# Patient Record
Sex: Female | Born: 1970 | Race: Black or African American | Hispanic: No | Marital: Single | Smoking: Never smoker
Health system: Southern US, Community
[De-identification: ages and names within clinical notes are randomized; demographics above are authoritative.]

## PROBLEM LIST (undated history)

## (undated) DIAGNOSIS — D649 Anemia, unspecified: Secondary | ICD-10-CM

## (undated) DIAGNOSIS — F419 Anxiety disorder, unspecified: Secondary | ICD-10-CM

## (undated) DIAGNOSIS — T7840XA Allergy, unspecified, initial encounter: Secondary | ICD-10-CM

## (undated) HISTORY — PX: NASAL SEPTUM SURGERY: SHX37

## (undated) HISTORY — PX: FOOT SURGERY: SHX648

## (undated) HISTORY — DX: Anxiety disorder, unspecified: F41.9

## (undated) HISTORY — PX: COSMETIC SURGERY: SHX468

## (undated) HISTORY — DX: Allergy, unspecified, initial encounter: T78.40XA

## (undated) HISTORY — DX: Anemia, unspecified: D64.9

## (undated) HISTORY — PX: PR UNLISTED PROCEDURE SHOULDER: 23929

## (undated) DEATH — deceased

---

## 2008-01-28 ENCOUNTER — Encounter: Payer: Self-pay | Admitting: Physician Assistant

## 2008-03-31 ENCOUNTER — Encounter: Payer: Self-pay | Admitting: Physician Assistant

## 2010-05-17 ENCOUNTER — Telehealth (INDEPENDENT_AMBULATORY_CARE_PROVIDER_SITE_OTHER): Payer: Self-pay | Admitting: *Deleted

## 2010-05-17 ENCOUNTER — Encounter (INDEPENDENT_AMBULATORY_CARE_PROVIDER_SITE_OTHER): Payer: Self-pay | Admitting: *Deleted

## 2010-05-17 ENCOUNTER — Emergency Department (HOSPITAL_COMMUNITY): Admission: EM | Admit: 2010-05-17 | Discharge: 2010-05-17 | Payer: Self-pay | Admitting: Emergency Medicine

## 2010-05-18 ENCOUNTER — Telehealth (INDEPENDENT_AMBULATORY_CARE_PROVIDER_SITE_OTHER): Payer: Self-pay | Admitting: *Deleted

## 2010-05-21 ENCOUNTER — Ambulatory Visit: Payer: Self-pay | Admitting: Internal Medicine

## 2010-05-21 DIAGNOSIS — R197 Diarrhea, unspecified: Secondary | ICD-10-CM | POA: Insufficient documentation

## 2010-05-21 DIAGNOSIS — A0472 Enterocolitis due to Clostridium difficile, not specified as recurrent: Secondary | ICD-10-CM | POA: Insufficient documentation

## 2010-05-21 DIAGNOSIS — K219 Gastro-esophageal reflux disease without esophagitis: Secondary | ICD-10-CM | POA: Insufficient documentation

## 2010-06-22 ENCOUNTER — Telehealth: Payer: Self-pay | Admitting: Physician Assistant

## 2010-11-20 NOTE — Progress Notes (Signed)
Summary: New PT appt  Phone Note Outgoing Call Call back at [facility] Phone [phone]   Call placed by: Chales Abrahams CMA Duncan Dull),  May 17, 2010 2:45 PM Summary of Call: called pt with appt time and date 05/21/10 Amy at 1:30 pm letter mailed Initial call taken by: Chales Abrahams CMA Duncan Dull),  May 17, 2010 2:45 PM

## 2010-11-20 NOTE — Progress Notes (Signed)
Summary: Request for an MD appt  Phone Note Call from Patient   Caller: Patient Summary of Call: Pt is calling with concerns about seeing  any provider  other than an MD.  She states she will ONLY see an MD and was requesting to see the MD that was speaking with the ER Dr yesterday when she was there.  I explained that was Willette Cluster NP.  She insist that she see an MD on Monday.  I explained there are no available appointments with an MD on Monday.    I tried to explain that the NP and PA work directly with a Dr and she was adamant about seeing an MD.  I advised her I would speak with Gunnar Fusi and give her a call back. Initial call taken by: Chales Abrahams CMA Duncan Dull),  May 18, 2010 9:22 AM  Follow-up for Phone Call        I spoke with Dr Russella Dar (doc of the day)  who suggest the pt keep the  appt with [facility].  If the pt is unwilling to keep this appt she can be scheduled with the first available MD or see her PCP.  left message on machine to call back Chales Abrahams CMA Duncan Dull)  May 21, 2010 9:55 AM   Additional Follow-up for Phone Call Additional follow up Details #1::        pt called back and is willing to see Amy today.  She has questions about her insurance and Morrie Sheldon advised her to call her [employer]. Additional Follow-up by: Chales Abrahams CMA Duncan Dull),  May 21, 2010 10:23 AM

## 2010-11-20 NOTE — Progress Notes (Signed)
Summary: complaint  Phone Note Call from Patient Call back at Home Phone [phone]   Caller: Patient Call For: Clarnce Flock  Summary of Call: pt has a complaint about her bill regarding her ov with Amy Esterwood on 05/21/2010... pt said that she spoke with someone at the front desk (doesnt have a name) prior to scheduling the appt and asked how much the visit was going to cost as a self-pay pt... pt states that she was told her appt would cost her $184... pt understood this to mean that her appt in its entirity would cost $184 total... pt now billed $365 with a $345 balance (she paid only $20 copay on the day of her ov) and she says had she known that it would be more than $184, she would not have sch'ed... pt said she cannot afford the $345 bill... already explained to pt the price range of our new patient visits and that $184 is the AVERAGE of this range Initial call taken by: Vallarie Mare,  June 22, 2010 8:59 AM  Follow-up for Phone Call        after consulting with Aurea Graff, I called/spoke with pt yesterday to inform her that her bill will be reprocessed to reflect the $184 charge and $20 payment already made... told pt she will be receiving the new bill in the near future... pt was very pleased and thankful... told the pt to please call me back with any questions Follow-up by: Vallarie Mare,  July 06, 2010 10:07 AM

## 2010-11-20 NOTE — Assessment & Plan Note (Signed)
Summary: C diff/pl   per Gunnar Fusi   History of Present Illness Visit Type: new patient  Primary GI MD: Yancey Flemings MD Primary Provider: Lyman Speller. Denny Peon, MD  Requesting Provider: na Chief Complaint: Diarrhea, bloating, left side abd pain,  headaches, and fatigue History of Present Illness:   40 Y.O FEMALE NEW TO G.I TODAY-REFERRED BY ER. PT HAD ONSET OF DIARRHEA, FEVER,MALAISE,NAUSEA ABOUT 8 DAYS AGO. SHEHAD TAKEN A COURSE OF CLINDAMYCIN FOR A SORE THROAT-FINISHED ABOUT  3 WEEKS BEFORE SXS STARTED. SHE BEGAN WITH ABDOMINAL DISCOMFORT, CRAMPING,AND 7-8 WATERY BLOODY STOOLS PER DAY.. SHE WAS SEEN BACK AT URGENT CARE AND STARTED ON FLAGYL ON 7/26. SHE HAD STOOL FOR C.DIFF WHICH WAS NEGATIVE. SHE WENT TO THE E.R. ON 7/29 AS SHE WAS NOT FEELING ANY BETTER. SHE HAS BEEN STRESSED BECAUSE SHE JUST MOVED HERE AND STARTED A NEW JOB. IN ER,CBC,CMET UNREMARKABLE,STOOL HEME POSITIVE. SHE WAS CONTINUED ON FLAGYL 500 MG 3 X DAILY ,AND FLORASTOR ADDED. SHE IS IMPROVING AT THIS POINT,STILL FEELS VERY TIRED,BUT IS EATING, NO FEVERS PAST 3-4 DAYS. SHE HAS SOME MILD CRAMPING, AND STOOLS ARE LOOSE,NON BLOODY -NOW WITH 3 BM'S PER DAY. SHE DOES HAVE HX OF GERD. SHE HAD GI EVALUATION IN [place] A FEW YEARS AGO,THINKS SHE HAD A COLONOSCOPY AND EGD.SHE HAD A WORKUP TO R/O CELIAC DISEASE WHICH WAS NEGATIVE BUT SHE FEELS SHE IS GLUTEN SENSITIVE.   GI Review of Systems    Reports abdominal pain, bloating, and  nausea.     Location of  Abdominal pain: generalized.    Denies acid reflux, belching, chest pain, dysphagia with liquids, dysphagia with solids, heartburn, loss of appetite, vomiting, vomiting blood, and  weight loss.      Reports change in bowel habits, diarrhea, and  rectal bleeding.     Denies anal fissure, Lieske tarry stools, diverticulosis, fecal incontinence, heme positive stool, hemorrhoids, irritable bowel syndrome, jaundice, light color stool, liver problems, and  rectal pain.    Current Medications  (verified): 1)  Zyrtec Allergy 10 Mg Tabs (Cetirizine Hcl) .... One Tablet By Mouth Once Daily 2)  Nasonex 50 Mcg/act Susp (Mometasone Furoate) .... As Needed 3)  Florastor 250 Mg Caps (Saccharomyces Boulardii) .... One Tablet By Mouth Two Times A Day 4)  Flagyl 500 Mg Tabs (Metronidazole) .... One Tablet By Mouth Three Times A Day 5)  Vitamin B Complex-C   Caps (B Complex-C) .... One Tablet By Mouth Once Daily 6)  Vitamin C 500 Mg  Tabs (Ascorbic Acid) .... One Tablet By Mouth Once Daily 7)  Vitamin D 1000 Unit  Tabs (Cholecalciferol) .... One Tablet By Mouth Once Daily 8)  Vitamin E 600 Unit  Caps (Vitamin E) .... One Capsule By Mouth Once Daily 9)  Multivitamins   Tabs (Multiple Vitamin) .... One Tablet By Mouth Once Daily 10)  Ferrous Sulfate 325 (65 Fe) Mg Tabs (Ferrous Sulfate) .... One Tablet By Mouth Once Daily  Allergies (verified): No Known Drug Allergies  Past History:  Past Medical History: HX OF FE DEFICIENCY ANEMIA Allergies/Sinus   Past Surgical History: Right Foot Surgery Nasal Surgey   Family History: No FH of Colon Cancer: Family History of Breast Cancer:MGM Family History of Diabetes: MGM, mother Family History of Heart Disease: MGF Family History of Kidney Disease:MGM  Social History: Interior and spatial designer of [employer] Single No Childern Patient has never smoked.  Alcohol Use - no Illicit Drug Use - no Smoking Status:  never Drug Use:  no  Review of Systems  The patient complains of allergy/sinus, fatigue, fever, headaches-new, and night sweats.  The patient denies anemia, anxiety-new, arthritis/joint pain, back pain, blood in urine, breast changes/lumps, change in vision, confusion, cough, coughing up blood, depression-new, fainting, hearing problems, heart murmur, heart rhythm changes, itching, menstrual pain, muscle pains/cramps, nosebleeds, pregnancy symptoms, shortness of breath, skin rash, sleeping problems, sore throat, swelling of feet/legs, swollen  lymph glands, thirst - excessive , urination - excessive , urination changes/pain, urine leakage, vision changes, and voice change.         ROS OTHERWISE AS IN HPI  Vital Signs:  Patient profile:   40 year old female Height:      65 inches Weight:      168 pounds BMI:     28.06 BSA:     1.84 Temp:     98.7 degrees F oral Pulse rate:   88 / minute Pulse rhythm:   regular BP sitting:   124 / 76  (left arm) Cuff size:   regular  Vitals Entered By: Ok Anis CMA (May 21, 2010 1:50 PM)  Physical Exam  General:  Well developed, well nourished, no acute distress. Head:  Normocephalic and atraumatic. Eyes:  PERRLA, no icterus. Lungs:  Clear throughout to auscultation. Heart:  Regular rate and rhythm; no murmurs, rubs,  or bruits. Abdomen:  SOFT,MILD RATHER DIFFUSE TENDERNESS, NON FOCAL. NO MASS OR HSM,BS+ Rectal:  NOT DONE Extremities:  No clubbing, cyanosis, edema or deformities noted. Neurologic:  Alert and  oriented x4;  grossly normal neurologically. Psych:  Alert and cooperative. Normal mood and affect.   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 SHE IS IMPROVING ON METRONIDAZOLE.   CONTINUE FLAGYL 500 MG 3 X DAILY X 14 DAYS TOTAL CONTINUE FLORASTOR TWICE DAILY X 3 WEEKS DISCUSSION REGARDING POTENTIAL RELAPSE ETC. PT AWARE TO CALL IF SXS WORSEN OR RECUR AFTER  FINISHING CURRENT COURSE OF ABX. AVOID CLINDAMYCIN IN THE FUTURE. FOLLOW UP IN OFFICE IN 3-4 WEEKS-DR.PERRY OBTAIN PREVIOUS GI RECORDS FROM [facility].  Patient Instructions: 1)  We sent prescriptions to [facility] for Flagyl and the Florastor Probiotic.  2)  We also made you a follow up appointment with Dr. Marina Goodell for 07-02-10, Mon at 1:30PM.  3)  Copy sent to : Lyman Speller. Denny Peon, MD 4)  The medication list was reviewed and reconciled.  All changed / newly prescribed medications were explained.   A complete medication list was provided to the patient / caregiver. Prescriptions: FLORASTOR 250 MG CAPS (SACCHAROMYCES BOULARDII) Take 1 tab twice daily x 14 days  #28 x 0   Entered by:   Lowry Ram NCMA   Authorized by:   Sammuel Cooper PA-c   Signed by:   Lowry Ram NCMA on 05/21/2010   Method used:   Electronically to        [phone][facility] (retail)       [address]       [place], [place]  [address]       Ph: [phone] or [phone]       Fax: [phone]   RxID:   [id number] FLAGYL 500 MG TABS (METRONIDAZOLE) Take 1 tab 3 times daily x 4 days  #12 x 0   Entered by:   Lowry Ram NCMA   Authorized by:   Sammuel Cooper PA-c   Signed by:   Lowry Ram NCMA  on 05/21/2010   Method used:   [facility] [phone][facility] ([facility]ail)       [address]       [place], [place]  [address]       Ph: [phone] or [phone]       Fax: [phone]   RxID:   [id number]

## 2010-11-20 NOTE — Letter (Signed)
Summary: New Patient letter  [facility]  [address] [place], [place] [address]   Phone: [phone]  Fax: [phone]       05/17/2010 MRN: [id number]  [facility] [address] [place], [place]  [address]  Dear Tara Beard,  Welcome to the Gastroenterology Division at [facility].    You are scheduled to see Tara Beard ,PA  on 05/21/10  at 1:30 pm on the 3rd floor at [facility], [address]  We ask that you try to arrive at our office 15 minutes prior to your appointment time to allow for check-in.  We would like you to complete the enclosed self-administered evaluation form prior to your visit and bring it with you on the day of your appointment.  We will review it with you.  Also, please bring a complete list of all your medications or, if you prefer, bring the medication bottles and we will list them.  Please bring your insurance card so that we may make a copy of it.  If your insurance requires a referral to see a specialist, please bring your referral form from your primary care physician.  Co-payments are due at the time of your visit and may be paid by cash, check or credit card.     Your office visit will consist of a consult with your physician (includes a physical exam), any laboratory testing he/she may order, scheduling of any necessary diagnostic testing (e.g. x-ray, ultrasound, CT-scan), and scheduling of a procedure (e.g. Endoscopy, Colonoscopy) if required.  Please allow enough time on your schedule to allow for any/all of these possibilities.    If you cannot keep your appointment, please call [phone] to cancel or reschedule prior to your appointment date.  This allows [place] the opportunity to schedule an appointment for another patient in need of care.  If you do not cancel or reschedule by 5 p.m. the business day prior to your appointment date, you will be charged a $50.00 late cancellation/no-show fee.    Thank you for  choosing [facility] for your medical needs.  We appreciate the opportunity to care for you.  Please visit [place] at our website  to learn more about our practice.                     Sincerely,                                                             The Gastroenterology Division   Appended Document: New Patient letter mailed

## 2010-12-28 ENCOUNTER — Other Ambulatory Visit (HOSPITAL_COMMUNITY): Payer: Self-pay | Admitting: Obstetrics and Gynecology

## 2010-12-28 DIAGNOSIS — N970 Female infertility associated with anovulation: Secondary | ICD-10-CM

## 2011-01-02 ENCOUNTER — Ambulatory Visit (HOSPITAL_COMMUNITY)
Admission: RE | Admit: 2011-01-02 | Discharge: 2011-01-02 | Disposition: A | Discharge status: Home / Self Care | Payer: Managed Care, Other (non HMO) | Source: Ambulatory Visit | Attending: Obstetrics and Gynecology | Admitting: Obstetrics and Gynecology

## 2011-01-02 ENCOUNTER — Ambulatory Visit (HOSPITAL_COMMUNITY): Admission: RE | Admit: 2011-01-02 | Payer: Managed Care, Other (non HMO) | Source: Ambulatory Visit

## 2011-01-02 ENCOUNTER — Ambulatory Visit (HOSPITAL_COMMUNITY): Payer: Managed Care, Other (non HMO)

## 2011-01-02 DIAGNOSIS — N970 Female infertility associated with anovulation: Secondary | ICD-10-CM

## 2011-01-02 DIAGNOSIS — N979 Female infertility, unspecified: Secondary | ICD-10-CM | POA: Insufficient documentation

## 2011-01-05 LAB — BASIC METABOLIC PANEL
BUN: 4 mg/dL — ABNORMAL LOW (ref 6–23)
Calcium: 9.2 mg/dL (ref 8.4–10.5)
Creatinine, Ser: 0.78 mg/dL (ref 0.4–1.2)
GFR calc non Af Amer: >60 mL/min (ref >60)
Glucose, Bld: 107 mg/dL — ABNORMAL HIGH (ref 70–99)
Potassium: 3.6 mEq/L (ref 3.5–5.1)

## 2011-01-05 LAB — HEMOCCULT GUIAC POC 1CARD (OFFICE): Fecal Occult Bld: POSITIVE

## 2011-01-05 LAB — CBC
MCHC: 34 g/dL (ref 30.0–36.0)
Platelets: 199 10*3/uL (ref 150–400)
RDW: 12.1 % (ref 11.5–15.5)
WBC: 4.6 10*3/uL (ref 4.0–10.5)

## 2011-01-05 LAB — GIARDIA/CRYPTOSPORIDIUM SCREEN(EIA)
Cryptosporidium Screen (EIA): NEGATIVE
Giardia Screen - EIA: NEGATIVE

## 2011-01-05 LAB — DIFFERENTIAL
Basophils Absolute: 0 10*3/uL (ref 0.0–0.1)
Basophils Relative: 0 % (ref 0–1)
Lymphocytes Relative: 22 % (ref 12–46)
Neutro Abs: 3 10*3/uL (ref 1.7–7.7)
Neutrophils Relative %: 66 % (ref 43–77)

## 2011-01-05 LAB — OVA AND PARASITE EXAMINATION: Ova and parasites: NONE SEEN

## 2011-01-05 LAB — CLOSTRIDIUM DIFFICILE EIA: C difficile Toxins A+B, EIA: NEGATIVE

## 2011-01-05 LAB — STOOL CULTURE

## 2011-01-05 LAB — URINALYSIS, ROUTINE W REFLEX MICROSCOPIC
Bilirubin Urine: NEGATIVE
Glucose, UA: NEGATIVE mg/dL
Hgb urine dipstick: NEGATIVE
Specific Gravity, Urine: 1.004 — ABNORMAL LOW (ref 1.005–1.030)
Urobilinogen, UA: 0.2 mg/dL (ref 0.0–1.0)
pH: 7 (ref 5.0–8.0)

## 2011-01-05 LAB — PREGNANCY, URINE: Preg Test, Ur: NEGATIVE

## 2013-05-22 ENCOUNTER — Ambulatory Visit (INDEPENDENT_AMBULATORY_CARE_PROVIDER_SITE_OTHER): Payer: BC Managed Care – PPO | Admitting: Family Medicine

## 2013-05-22 ENCOUNTER — Ambulatory Visit: Payer: BC Managed Care – PPO

## 2013-05-22 VITALS — BP 116/78 | HR 77 | Temp 98.5°F | Resp 16 | Ht 65.5 in | Wt 195.0 lb

## 2013-05-22 DIAGNOSIS — M542 Cervicalgia: Secondary | ICD-10-CM

## 2013-05-22 DIAGNOSIS — R202 Paresthesia of skin: Secondary | ICD-10-CM

## 2013-05-22 DIAGNOSIS — R200 Anesthesia of skin: Secondary | ICD-10-CM

## 2013-05-22 DIAGNOSIS — M25539 Pain in unspecified wrist: Secondary | ICD-10-CM

## 2013-05-22 DIAGNOSIS — G5621 Lesion of ulnar nerve, right upper limb: Secondary | ICD-10-CM

## 2013-05-22 DIAGNOSIS — M25531 Pain in right wrist: Secondary | ICD-10-CM

## 2013-05-22 DIAGNOSIS — R209 Unspecified disturbances of skin sensation: Secondary | ICD-10-CM

## 2013-05-22 DIAGNOSIS — G5620 Lesion of ulnar nerve, unspecified upper limb: Secondary | ICD-10-CM

## 2013-05-22 LAB — GLUCOSE, POCT (MANUAL RESULT ENTRY): POC Glucose: 97 mg/dl (ref 70–99)

## 2013-05-22 NOTE — Patient Instructions (Addendum)
Ulnar Nerve Contusion with Rehab The ulnar nerve lies near the surface of the skin as it passes by the elbow. This location causes it to be susceptible to injury. An ulnar nerve contusion is a bruise of the nerve. It is the result of direct trauma to the elbow. Ulnar nerve contusions are characterized by pain, weakness, and loss of feeling in the hand. SYMPTOMS   Signs of nerve damage include: tingling, numbness, weakness, and/or loss of feeling in the hand, specifically the little finger and ring finger.  Sharp pains that may shoot from the elbow to the wrist and hand.  Decreased hand function.  Tenderness and/ or inflammation in the elbow.  Muscle wasting (atrophy) in the hand. CAUSES  Ulnar nerve contusions are caused by direct trauma to the elbow that results in bleeding which enters the nerve. RISK INCREASES WITH:  Contact sports (football, soccer, or rugby).  Bleeding disorders.  Taking blood thinning medicine (warfarin [Coumadin], aspirin, or nonsteroidal anti-inflammatory medications).  Diabetes mellitus.  Underactive thyroid gland (hypothyroidism). PREVENTION  Wear properly fitted and padded protective equipment.  Only take blood thinning medication when necessary. PROGNOSIS  Ulnar nerve contusions usually heal within 6 weeks. Healing often occurs spontaneously, but treatment helps reduce symptoms.  RELATED COMPLICATIONS   Permanent nerve damage, including pain, numbness, tingling, or weakness in the hand (rare).  Weak grip.  Prolonged healing time, if improperly treated or re-injured. TREATMENT  Treatment initially involves resting from any activities that aggravate the symptoms, and the use of ice and medications to help reduce pain and inflammation. The use of strengthening and stretching exercises may help reduce pain with activity. These exercises may be performed at home or with referral to a therapist. Your caregiver may recommend that you splint the elbow at  night to help healing of the nerve. If symptoms persist despite conservative (non-surgical) treatment, then surgery may be recommended to free the nerve. MEDICATION   If pain medication is necessary, then nonsteroidal anti-inflammatory medications, such as aspirin and ibuprofen, or other minor pain relievers, such as acetaminophen, are often recommended.  Do not take pain medication within 7 days before surgery.  Prescription pain relievers may be given if deemed necessary by your caregiver. Use only as directed and only as much as you need. COLD THERAPY  Cold treatment (icing) relieves pain and reduces inflammation. Cold treatment should be applied for 10 to 15 minutes every 2 to 3 hours for inflammation and pain and immediately after any activity that aggravates your symptoms. Use ice packs or massage the area with a piece of ice (ice massage). SEEK MEDICAL CARE IF:   Treatment seems to offer no benefit, or the condition worsens.  Any medications produce adverse side effects. EXERCISES RANGE OF MOTION (ROM) AND STRETCHING EXERCISES - Ulnar Nerve Contusion These exercises may help you when beginning to rehabilitate your injury. Do not begin these exercises until your physician, physical therapist or athletic trainer advises you to do so. Discontinue any exercise that worsens your symptoms. Contact your physician with instructions on how to continue. Your symptoms may resolve with or without further involvement from your physician, physical therapist or athletic trainer. While completing these exercises, remember:  Restoring tissue flexibility helps normal motion to return to the joints. This allows healthier, less painful movement and activity.  An effective stretch should be held for at least 30 seconds.  A stretch should never be painful. You should only feel a gentle lengthening or release in the stretched tissue. RANGE   OF MOTION  Extension  Hold your right / left arm at your side and  straighten your elbow as far as you can using your right / left arm muscles.  Straighten the right / left elbow farther by gently pushing down on your forearm until you feel a gentle stretch on the inside of your elbow. Hold this position for __________ seconds.  Slowly return to the starting position. Repeat __________ times. Complete this exercise __________ times per day.  RANGE OF MOTION  Flexion  Hold your right / left arm at your side and bend your elbow as far as you can using your right / left arm muscles.  Bend the right / left elbow farther by gently pushing up on your forearm until you feel a gentle stretch on the outside of your elbow. Hold this position for __________ seconds.  Slowly return to the starting position. Repeat __________ times. Complete this exercise __________ times per day.  RANGE OF MOTION  Wrist Flexion, Active-Assisted  Extend your right / left elbow with your fingers pointing down.*  Gently pull the back of your hand towards you until you feel a gentle stretch on the top of your forearm.  Hold this position for __________ seconds. Repeat __________ times. Complete this exercise __________ times per day.  *If directed by your physician, physical therapist or athletic trainer, complete this stretch with your elbow bent rather than extended. RANGE OF MOTION  Wrist Extension, Active-Assisted  Extend your right / left elbow and turn your palm upwards.*  Gently pull your palm/fingertips back so your wrist extends and your fingers point more toward the ground.  You should feel a gentle stretch on the inside of your forearm.  Hold this position for __________ seconds. Repeat __________ times. Complete this exercise __________ times per day. *If directed by your physician, physical therapist or athletic trainer, complete this stretch with your elbow bent, rather than extended. RANGE OF MOTION  Supination, Active  Stand or sit with your elbows at your side.  Bend your right / left elbow to 90 degrees.  Turn your palm upward until you feel a gentle stretch on the inside of your forearm.  Hold this position for __________ seconds. Slowly release and return to the starting position. Repeat __________ times. Complete this stretch __________ times per day.  RANGE OF MOTION  Pronation, Active  Stand or sit with your elbows at your side. Bend your right / left elbow to 90 degrees.  Turn your palm downward until you feel a gentle stretch on the top of your forearm.  Hold this position for __________ seconds. Slowly release and return to the starting position. Repeat __________ times. Complete this stretch __________ times per day.  STRETCH - Wrist Flexion   Place the back of your right / left hand on a tabletop leaving your elbow slightly bent. Your fingers should point away from your body.  Gently press the back of your hand down onto the table by straightening your elbow. You should feel a stretch on the top of your forearm.  Hold this position for __________ seconds. Repeat __________ times. Complete this stretch __________ times per day.  STRETCH  Wrist Extension   Place your right / left fingertips on a tabletop leaving your elbow slightly bent. Your fingers should point backwards.  Gently press your fingers and palm down onto the table by straightening your elbow. You should feel a stretch on the inside of your forearm.  Hold this position for __________   seconds. Repeat __________ times. Complete this stretch __________ times per day.  STRENGTHENING EXERCISES - Ulnar Nerve Contusion These exercises may help you when beginning to rehabilitate your injury. Do not begin these exercises until your physician, physical therapist or athletic trainer advises you to do so. Discontinue any exercise that worsens your symptoms. Contact your physician for instructions on how to continue. They may resolve your symptoms with or without further involvement  from your physician, physical therapist or athletic trainer. While completing these exercises, remember:   Muscles can gain both the endurance and the strength needed for everyday activities through controlled exercises.  Complete these exercises as instructed by your physician, physical therapist or athletic trainer. Progress with the resistance and repetition exercises only as your caregiver advises. STRENGTH Wrist Flexors  Sit with your right / left forearm palm-up and fully supported. Your elbow should be resting below the height of your shoulder. Allow your wrist to extend over the edge of the surface.  Loosely holding a __________ weight or a piece of rubber exercise band/tubing, slowly curl your hand up toward your forearm.  Hold this position for __________ seconds. Slowly lower the wrist back to the starting position in a controlled manner. Repeat __________ times. Complete this exercise __________ times per day.  STRENGTH  Wrist Extensors  Sit with your right / left forearm palm-down and fully supported. Your elbow should be resting below the height of your shoulder. Allow your wrist to extend over the edge of the surface.  Loosely holding a __________ weight or a piece of rubber exercise band/tubing, slowly curl your hand up toward your forearm.  Hold this position for __________ seconds. Slowly lower the wrist back to the starting position in a controlled manner. Repeat __________ times. Complete this exercise __________ times per day.  STRENGTH - Ulnar Deviators  Stand with a ____________________ weight in your right / left hand or sit holding on to the rubber exercise band/tubing with your opposite arm supported.  Move your wrist so that your pinkie travels toward your forearm and your thumb moves away from your forearm.  Hold this position for __________ seconds and then slowly lower the wrist back to the starting position. Repeat __________ times. Complete this exercise  __________ times per day STRENGTH - Radial Deviators  Stand with a ____________________ weight in your right / left hand or sit holding on to the rubber exercise band/tubing with your arm supported.  Raise your hand upward in front of you or pull up on the rubber tubing.  Hold this position for __________ seconds and then slowly lower the wrist back to the starting position. Repeat __________ times. Complete this exercise __________ times per day. STRENGTH - Grip  Grasp a tennis ball, a dense sponge, or a large, rolled sock in your hand.  Squeeze as hard as you can without increasing any pain.  Hold this position for __________ seconds. Release your grip slowly. Repeat __________ times. Complete this exercise __________ times per day.  Document Released: 10/07/2005 Document Revised: 12/30/2011 Document Reviewed: 01/19/2009 [employer] Patient Information 2014 [employer].  

## 2013-05-22 NOTE — Progress Notes (Signed)
[address]   [place], [place]  [address]   [phone]  Subjective:    Patient ID: Tara Beard, female    DOB: May 23, 1971, 42 y.o.   MRN: [id number]  HPI This 42 y.o. female presents for evaluation of numbness of hand for four days.  R handed; R hand dominant.  Onset gradually throughout the day.  Limited to 4th and 5th fingers and radiates into ulnar aspect of forearm.  PCP in [place].  Recent labs normal by PCP two months ago including glucose, CBC, CMET, TSH ,free T4, iron levels.  +Lower back pain in past six months; s/p xrays by PCP; +DDD lumbar.  No sciatica symptoms at that time.  No n/t in legs.  No new medications.  +Stress lately related to work.  +Excessive work for two weeks.  Similar episodes two years ago; work related. [employer] employee.  +Some neck pain; some neck discomfort; +hold stress in neck.  Took Advil last night without improvement.  Today tingling less however.  Has been cleansing for the past two weeks; large amounts of fruit; wants sugar checked with new onset tingling/numbness.     Review of Systems  Constitutional: Negative for chills, diaphoresis and fatigue.  Endocrine: Negative for polydipsia, polyphagia and polyuria.  Musculoskeletal: Positive for myalgias and back pain.  Skin: Negative for rash.  Neurological: Positive for numbness. Negative for weakness.  Hematological: Negative for adenopathy. Does not bruise/bleed easily.   Past Medical History  Diagnosis Date  . Anemia   . Anxiety    Past Surgical History  Procedure Laterality Date  . Cosmetic surgery    . Foot surgery     History   Social History  . Marital Status: Single    Spouse Name: N/A    Number of Children: N/A  . Years of Education: N/A   Occupational History  . Not on file.   Social History Main Topics  . Smoking status: Never Smoker   . Smokeless tobacco: Not on file  . Alcohol Use: No  . Drug Use: No  . Sexually Active: Yes    Birth Control/ Protection: Condom   Other  Topics Concern  . Not on file   Social History Narrative  . No narrative on file   No current outpatient prescriptions on file prior to visit.   No current facility-administered medications on file prior to visit.   Family History  Problem Relation Age of Onset  . Diabetes Mother   . Diabetes Maternal Grandmother   . Heart disease Maternal Grandfather       Objective:   Physical Exam  Nursing note and vitals reviewed. Constitutional: She is oriented to person, place, and time. She appears well-developed and well-nourished. No distress.  Musculoskeletal:       Cervical back: She exhibits normal range of motion, no tenderness, no bony tenderness, no swelling, no pain and no spasm.  Neurological: She is alert and oriented to person, place, and time. She has normal reflexes. No cranial nerve deficit. She exhibits normal muscle tone. Coordination normal.  Skin: Skin is warm and dry. No rash noted. She is not diaphoretic.  Psychiatric: She has a normal mood and affect. Her behavior is normal.   [facility] reading (PRIMARY) by  Dr. Katrinka Blazing.  C-SPINE:  +NARROWING C4-C5.  Results for orders placed in visit on 05/22/13  POCT GLYCOSYLATED HEMOGLOBIN (HGB A1C)      Result Value Range   Hemoglobin A1C 4.5    GLUCOSE, POCT (MANUAL  RESULT ENTRY)      Result Value Range   POC Glucose 97  70 - 99 mg/dl       Assessment & Plan:  Numbness and tingling - Plan: POCT glycosylated hemoglobin (Hb A1C), DG Cervical Spine Complete, POCT glucose (manual entry)  Wrist pain, right  Paresthesia - Plan: DG Cervical Spine Complete  Ulnar neuropathy at wrist, right   1.  R ulnar neuropathy:  New.  Recommend rest, ice, wrist splint for the next two weeks.  Avoid compression of R forearm.  Recommend scheduled NSAIDs but pt reluctant; if no improvement in two weeks, agreeable to starting Advil tid.  Wrist splint placed to limit use and range of motion while at work. 2. Neck pain:  New.  Cervical spine with  narrowing C4-5.

## 2013-05-23 ENCOUNTER — Telehealth: Payer: Self-pay

## 2013-05-23 NOTE — Telephone Encounter (Signed)
Patient was seen recently by dr Katrinka Blazing. She is in a wrist splint for 3 weeks and would like a note for her employer. Its a personal injury not wc. She needs a note for restricted use of her wrist and would also like for the note to recommend ergonomic work conditions. Please call her back at [phone].

## 2013-05-24 NOTE — Telephone Encounter (Signed)
Note ready for employer; would she like [place] to fax it to someone or would she like to pick it up?

## 2013-05-25 NOTE — Telephone Encounter (Signed)
Called her, she wants me to fax it. She will call me back with the fax number.

## 2013-05-26 NOTE — Telephone Encounter (Signed)
Patient came by and picked up the letter.

## 2013-07-01 ENCOUNTER — Telehealth: Payer: Self-pay

## 2013-07-01 NOTE — Telephone Encounter (Signed)
Pt is calling to make sure that her records have been sent to [facility], she states that she signed a release a month ago when she was here. She has apt at 11:15 today and wants to make sure that her records have been sent. If not she states that it needs to be sent to attn: Dalbert Batman fax number is [phone] Call back number if any questions is [phone]

## 2013-07-01 NOTE — Telephone Encounter (Signed)
Patient did not sign an official ROI form requesting records to be sent to [facility]. She just noted on her [facility] hippa that we could discuss her health information with Dr Modesto Charon at this practice, but it wasn't a request for our office to send records. Therefore, no records were sent yet. Will send today.

## 2013-12-15 ENCOUNTER — Ambulatory Visit (INDEPENDENT_AMBULATORY_CARE_PROVIDER_SITE_OTHER): Payer: BC Managed Care – PPO | Admitting: Family Medicine

## 2013-12-15 ENCOUNTER — Encounter: Payer: Self-pay | Admitting: Family Medicine

## 2013-12-15 VITALS — BP 120/76 | HR 110 | Temp 98.7°F | Resp 16 | Ht 66.0 in | Wt 218.0 lb

## 2013-12-15 DIAGNOSIS — R5381 Other malaise: Secondary | ICD-10-CM

## 2013-12-15 DIAGNOSIS — N920 Excessive and frequent menstruation with regular cycle: Secondary | ICD-10-CM

## 2013-12-15 DIAGNOSIS — R5383 Other fatigue: Secondary | ICD-10-CM

## 2013-12-15 DIAGNOSIS — M25531 Pain in right wrist: Secondary | ICD-10-CM

## 2013-12-15 DIAGNOSIS — N852 Hypertrophy of uterus: Secondary | ICD-10-CM

## 2013-12-15 DIAGNOSIS — M25539 Pain in unspecified wrist: Secondary | ICD-10-CM

## 2013-12-15 DIAGNOSIS — M25532 Pain in left wrist: Secondary | ICD-10-CM

## 2013-12-15 LAB — CBC WITH DIFFERENTIAL/PLATELET
Basophils Absolute: 0 10*3/uL (ref 0.0–0.1)
Basophils Relative: 0 % (ref 0–1)
EOS PCT: 2 % (ref 0–5)
Eosinophils Absolute: 0.1 10*3/uL (ref 0.0–0.7)
HEMATOCRIT: 38.5 % (ref 36.0–46.0)
Hemoglobin: 12.9 g/dL (ref 12.0–15.0)
LYMPHS ABS: 2.6 10*3/uL (ref 0.7–4.0)
LYMPHS PCT: 49 % — AB (ref 12–46)
MCH: 29.2 pg (ref 26.0–34.0)
MCHC: 33.5 g/dL (ref 30.0–36.0)
MCV: 87.1 fL (ref 78.0–100.0)
Monocytes Absolute: 0.4 10*3/uL (ref 0.1–1.0)
Monocytes Relative: 7 % (ref 3–12)
Neutro Abs: 2.2 10*3/uL (ref 1.7–7.7)
Neutrophils Relative %: 42 % — ABNORMAL LOW (ref 43–77)
PLATELETS: 239 10*3/uL (ref 150–400)
RBC: 4.42 MIL/uL (ref 3.87–5.11)
RDW: 13.5 % (ref 11.5–15.5)
WBC: 5.3 10*3/uL (ref 4.0–10.5)

## 2013-12-15 LAB — POCT URINE PREGNANCY: Preg Test, Ur: NEGATIVE

## 2013-12-15 NOTE — Progress Notes (Signed)
Subjective:   This chart was scribed for Wardell Honour, MD by Forrestine Him, [facility] Sc[facility]. This patient was seen in room 21 and the patient's care was started 4:16 PM.    Patient ID: Tara Beard, female    DOB: [birth year], 43 y.o.   MRN: [id number]  12/15/2013  joints popping, Dysmenorrhea and Fatigue   HPI  HPI Comments: Tara Beard is a 43 y.o. Female with a PMHx of anemia and anxiety who presents to [facility] seeking follow up from last visit in 05/2013 regarding ulnar neuropathy R and  joint popping in her B wrists today. Pt reports progressively worsening popping described as "thobbing" and "aching" in all of her joints with associated intermittent inflammation. However, she reports improvement of numbness to the right hand with the assistance of brace given last visit. However, she says she has not had to use her brace in a number of weeks, but is requesting one for her left wrist as well. She reports noting the popping with basic movements of her joints, but states the discomfort has not been intense enough to take anything OTC; she has only experienced wrist throbbing on two days since last visit six months ago.  At this time she denies any numbness or paresthesia or weakness of wrists/hands. Denies swelling of wrists or hands; she is concerned about evolving OA.   Denies any past family history of autoimmune diseases. She states she plans to have some imaging done within the next few weeks ordered by her PCP for lumbar spine and R shoulder pain; she is not scheduled for imaging of wrists.  She also reports ongoing Dysmenorrhea and menorrhagia at this time. Pt states she was advised by her PCP in [place], [place] to follow up with an OB/GYN for further evaluation. She reports soaking through about 5 heavy pads during a work day. She states she typically bleeds the most during the 1st and 2nd day, and says the heavy bleeding levels off come the  3rd day of her menstrual period. She states her pain is moderate, but tries to stay away from any OTC medications for pain. She denies bleeding in between menstrual periods. Last pelvic exam with pap smear was in 2014. She reports normal pap smears, but states cysts are always noted during her annual pap exams.  Her PCP scheduled her for a pelvic u/s in the upcoming weeks.  Last year at her gynecological exam, uterine fibroids were noted on bimanual exam.  She has taken OCPs in the past without side effects.  She has no children and would possibly like children in the future; however, is willing to try an OCP to control bleeding.  No tobacco abuse.  No family history of early CAD or blood clotting hx.  She does travel with work regularly but always ambulates frequently on flights.  She reports new recent fatigue onset 1 to 2 weeks with associated low energy.  Her PCP did obtain thyroid studies and other labs last week; she does not have a copy of these labs with her today.  Pt was recently given a prescription of Macrobid 100 mg which she started a few days ago for possible UTI; her PCP prescribed this last week.  Pt reports a known allergy to adhesive.   Review of Systems  Constitutional: Positive for fatigue. Negative for fever and chills.  HENT: Negative for congestion.   Eyes: Negative for visual disturbance.  Respiratory: Negative for cough  and shortness of breath.   Cardiovascular: Negative for chest pain.  Gastrointestinal: Negative for abdominal pain.  Genitourinary: Positive for menstrual problem. Negative for dysuria.  Musculoskeletal: Positive for arthralgias, joint swelling and neck pain.  Skin: Negative.  Negative for rash.  Neurological: Negative for syncope, numbness and headaches.  Psychiatric/Behavioral: Negative for confusion.    Past Medical History  Diagnosis Date  . Anemia   . Anxiety    Allergies  Allergen Reactions  . Adhesive [Tape] Rash   Current Outpatient  Prescriptions  Medication Sig Dispense Refill  . Ascorbic Acid (VITAMIN C) 1000 MG tablet Take 1,000 mg by mouth daily.      . cetirizine (ZYRTEC) 10 MG chewable tablet Chew 10 mg by mouth daily.      Marland Kitchen escitalopram (LEXAPRO) 10 MG tablet Take 10 mg by mouth daily.      . fish oil-omega-3 fatty acids 1000 MG capsule Take 2 g by mouth daily.      Marland Kitchen lactobacillus acidophilus (BACID) TABS Take 2 tablets by mouth 3 (three) times daily.      . mometasone (NASONEX) 50 MCG/ACT nasal spray Place 2 sprays into the nose daily.      . Multiple Vitamins-Minerals (MULTIVITAMIN WITH MINERALS) tablet Take 1 tablet by mouth daily.      . nitrofurantoin, macrocrystal-monohydrate, (MACROBID) 100 MG capsule Take 100 mg by mouth 2 (two) times daily.      . Vitamin D, Ergocalciferol, (DRISDOL) 50000 UNITS CAPS capsule Take 50,000 Units by mouth every 7 (seven) days.       No current facility-administered medications for this visit.   History   Social History  . Marital Status: Single    Spouse Name: N/A    Number of Children: N/A  . Years of Education: N/A   Occupational History  . Not on file.   Social History Main Topics  . Smoking status: Never Smoker   . Smokeless tobacco: Not on file  . Alcohol Use: No  . Drug Use: No  . Sexual Activity: Yes    Birth Control/ Protection: Condom   Other Topics Concern  . Not on file   Social History Narrative   Marital status: single; dating      Children: none      Employment: works from home [employer] x 2 years.  Travels a lot.      Lives: alone in [place].      Tobacco: none       Alcohol:  Four times per year.              Family History  Problem Relation Age of Onset  . Diabetes Mother   . Hypertension Mother   . Diabetes Maternal Grandmother   . Heart disease Maternal Grandfather        Objective:    BP 120/76  Pulse 110  Temp(Src) 98.7 F (37.1 C)  Resp 16  Ht _0  (1.676 m)  Wt 218 lb (98.884 kg)  BMI 35.20 kg/m2  SpO2 98%   LMP 11/22/2013  Physical Exam  Nursing note and vitals reviewed. Constitutional: She is oriented to person, place, and time. She appears well-developed and well-nourished. No distress.  HENT:  Head: Normocephalic and atraumatic.  Eyes: Conjunctivae and EOM are normal. Pupils are equal, round, and reactive to light. Right eye exhibits no discharge. Left eye exhibits no discharge.  Neck: Normal range of motion.  Cardiovascular: Normal rate, regular rhythm and normal heart sounds.  Exam reveals  no gallop and no friction rub.   No murmur heard. Heart rate 85 during Physical Exam  Pulmonary/Chest: Effort normal and breath sounds normal. No respiratory distress. She has no wheezes. She has no rales. She exhibits no tenderness.  Abdominal: Soft. Bowel sounds are normal. She exhibits no distension and no mass. There is no tenderness. There is no rebound and no guarding.  Genitourinary: Vagina normal. There is no rash, tenderness or lesion on the right labia. There is no rash, tenderness or lesion on the left labia. Uterus is enlarged. Uterus is not fixed and not tender. Cervix exhibits no motion tenderness, no discharge and no friability. Right adnexum displays no mass, no tenderness and no fullness. Left adnexum displays no mass, no tenderness and no fullness. No erythema or tenderness around the vagina. No signs of injury around the vagina. No vaginal discharge found.  Musculoskeletal: She exhibits no edema and no tenderness.       Right shoulder: She exhibits decreased range of motion.       Left shoulder: Normal.       Right elbow: Normal.      Left elbow: Normal.       Right wrist: She exhibits crepitus. She exhibits normal range of motion, no tenderness, no bony tenderness, no swelling and no effusion.       Left wrist: She exhibits crepitus. She exhibits normal range of motion, no tenderness, no bony tenderness, no swelling and no effusion.       Right knee: Normal.       Left knee: Normal.        Right ankle: Normal.       Left ankle: Normal.       Cervical back: Normal.  Neurological: She is alert and oriented to person, place, and time.  Skin: Skin is warm and dry. No rash noted. She is not diaphoretic.  Psychiatric: She has a normal mood and affect. Her behavior is normal. Judgment and thought content normal.   Results for orders placed in visit on 12/15/13  CBC WITH DIFFERENTIAL      Result Value Ref Range   WBC 5.3  4.0 - 10.5 K/uL   RBC 4.42  3.87 - 5.11 MIL/uL   Hemoglobin 12.9  12.0 - 15.0 g/dL   HCT 38.5  36.0 - 46.0 %   MCV 87.1  78.0 - 100.0 fL   MCH 29.2  26.0 - 34.0 pg   MCHC 33.5  30.0 - 36.0 g/dL   RDW 13.5  11.5 - 15.5 %   Platelets 239  150 - 400 K/uL   Neutrophils Relative % 42 (*) 43 - 77 %   Neutro Abs 2.2  1.7 - 7.7 K/uL   Lymphocytes Relative 49 (*) 12 - 46 %   Lymphs Abs 2.6  0.7 - 4.0 K/uL   Monocytes Relative 7  3 - 12 %   Monocytes Absolute 0.4  0.1 - 1.0 K/uL   Eosinophils Relative 2  0 - 5 %   Eosinophils Absolute 0.1  0.0 - 0.7 K/uL   Basophils Relative 0  0 - 1 %   Basophils Absolute 0.0  0.0 - 0.1 K/uL   Smear Review Criteria for review not met    POCT URINE PREGNANCY      Result Value Ref Range   Preg Test, Ur Negative         Assessment & Plan:  Menorrhagia - Plan: POCT urine pregnancy, CBC with Differential  Pain  in both wrists  Other malaise and fatigue  Enlarged uterus  1. Menorrhagia:  New. Associated with enlarged uterus suggestive of uterine fibroids.  Pap smear UTD in 2014; pt to bring copy of recent pap smear at follow-up visit.  Scheduled for pelvic u/s in upcoming two weeks to confirm presence of uterine fibroids. If uterine fibroids confirmed, recommend trial of OCP.   2. Enlarged uterus:  New. Associated with menorrhagia; scheduled for pelvic u/s; s/p pap smear in 2014.  Uterine fibroids likely etiology. 3.  Crepitus in both wrists and other joints:  New. Reassurance provided; recommend regular exercising with  stretching before and after each exercise session.  If develops pain in any joints with increased popping, advised to RTC for imaging and further evaluation. 4.  Malaise and fatigue:  New.  Onset in past few weeks; no evidence of pregnancy or anemia; recent other lab panel obtained by PCP; confirm thyroid studies normal.   Meds ordered this encounter  Medications  . nitrofurantoin, macrocrystal-monohydrate, (MACROBID) 100 MG capsule    Sig: Take 100 mg by mouth 2 (two) times daily.  . Multiple Vitamins-Minerals (MULTIVITAMIN WITH MINERALS) tablet    Sig: Take 1 tablet by mouth daily.  . Ascorbic Acid (VITAMIN C) 1000 MG tablet    Sig: Take 1,000 mg by mouth daily.  . Vitamin D, Ergocalciferol, (DRISDOL) 50000 UNITS CAPS capsule    Sig: Take 50,000 Units by mouth every 7 (seven) days.    Return in about 1 month (around 01/12/2014).    I personally performed the services described in this documentation, which was scribed in my presence.  The recorded information has been reviewed and is accurate.  Reginia Forts, M.D.  [facility] [address] [place], [place]  [address] [phone] phone [phone] fax

## 2013-12-20 ENCOUNTER — Telehealth: Payer: Self-pay

## 2013-12-20 NOTE — Telephone Encounter (Signed)
Dr. Katrinka BlazingSmith:  She had a splint on her right wrist but she also needs one on her left but she thinks we forgot to give it to her on her last visit.  Patient wants to know if she can just run in and grab one (patient is under the impression that there is no charge for the wrist splints so I made her aware that there was a charge)   Please call her at [phone] and advise on the best way to approach a left wrist splint

## 2013-12-20 NOTE — Telephone Encounter (Signed)
Dr. Katrinka BlazingSmith I did not see in progress note that she needed one on both wrists, please advise

## 2013-12-21 NOTE — Telephone Encounter (Signed)
We did discuss he L wrist splint during the visit; please apologize to patient for not providing wrist splint during recent visit. She has two options:  1.  Purchase wrist splint at [facility]  2.  Obtain wrist splint at follow-up appointment in upcoming month.  There is no urgency to needing the wrist splint today since symptoms have been intermittent and ongoing for several months.

## 2013-12-22 NOTE — Telephone Encounter (Signed)
LMOM to CB. 

## 2013-12-23 NOTE — Telephone Encounter (Signed)
Spoke with pt and advised her of advice. She will wait until her follow up with you.

## 2014-01-13 ENCOUNTER — Encounter: Payer: Self-pay | Admitting: *Deleted

## 2014-01-13 DIAGNOSIS — Z9289 Personal history of other medical treatment: Secondary | ICD-10-CM | POA: Insufficient documentation

## 2015-02-12 IMAGING — CR DG CERVICAL SPINE COMPLETE 4+V
5 series · 5 of 5 positions shown · non-contrast
Comparison: None.

CLINICAL DATA: Hand numbness

CERVICAL SPINE - COMPLETE 4+ VIEW

[lpo]
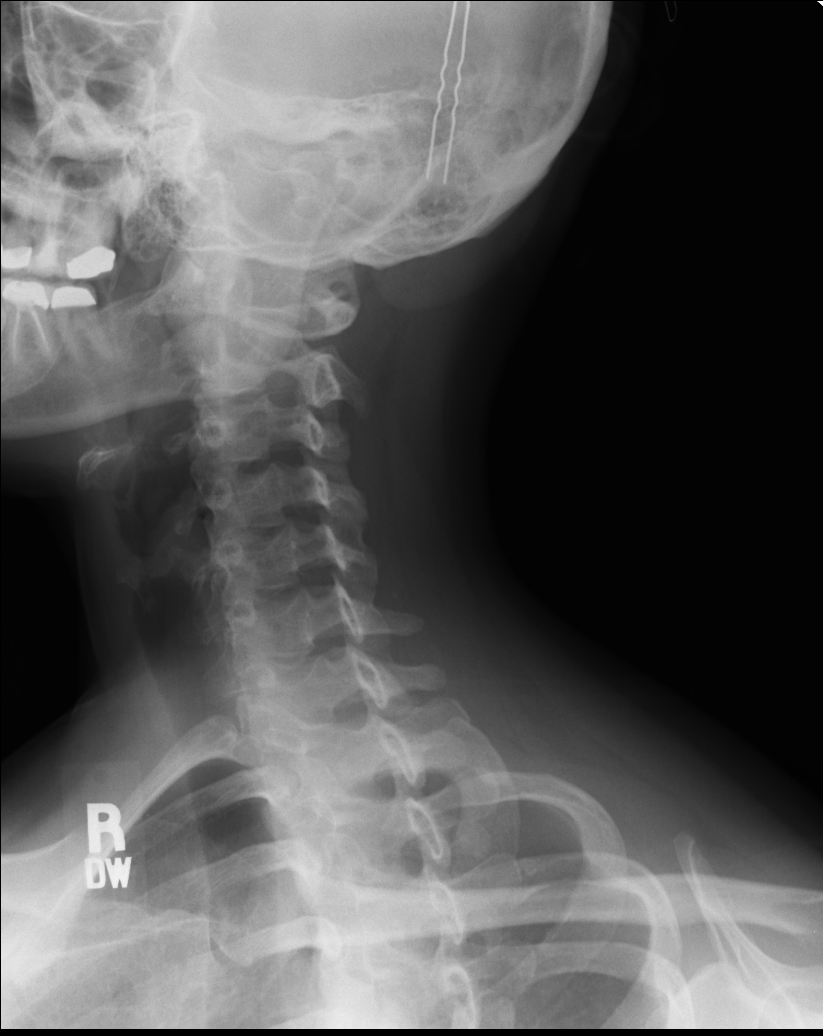

[lateral]
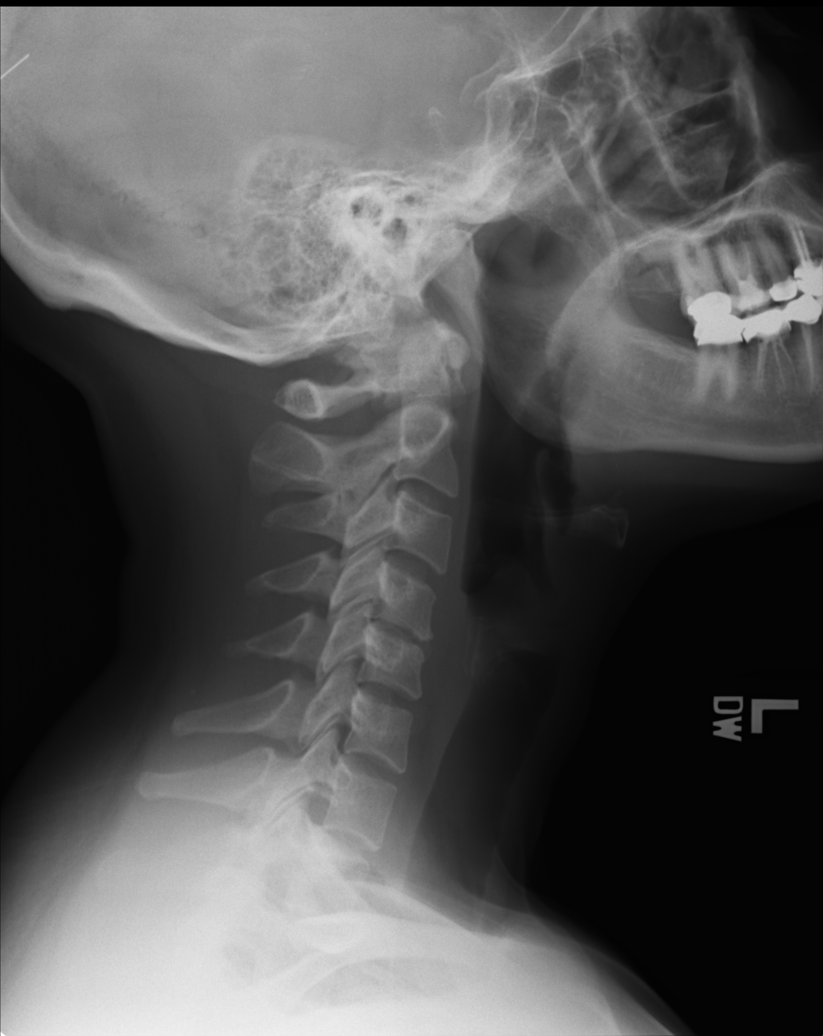

[rpo]
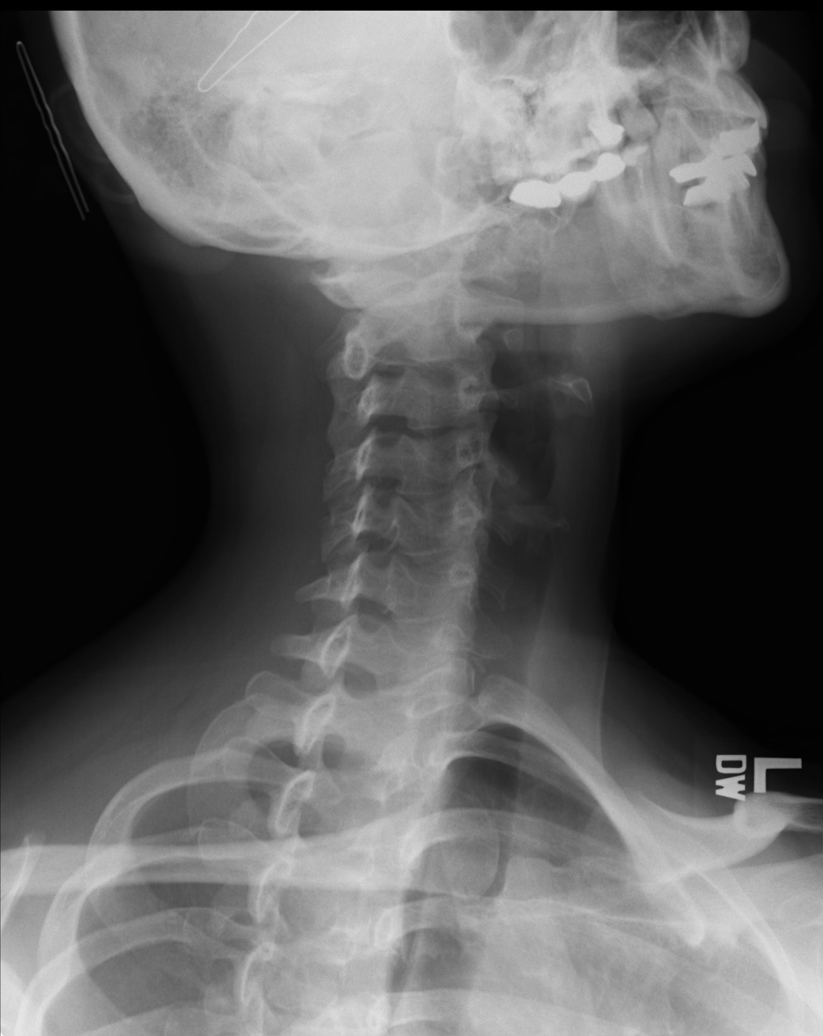

[AP]
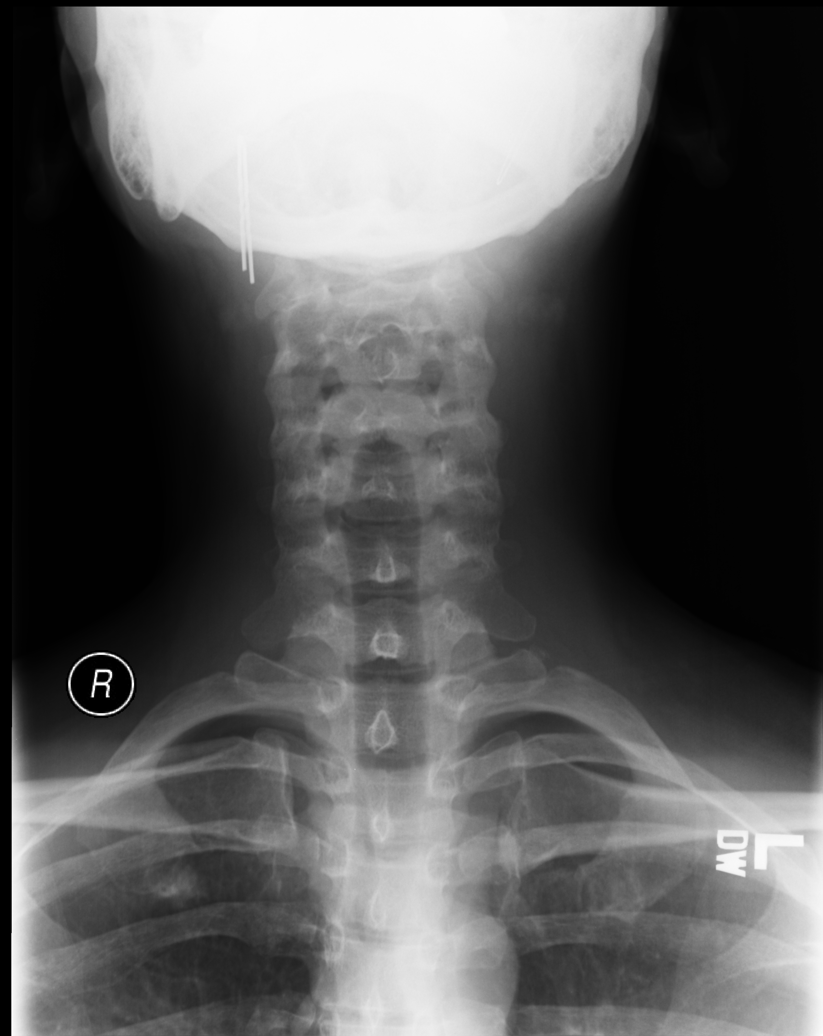

[ap open mouth]
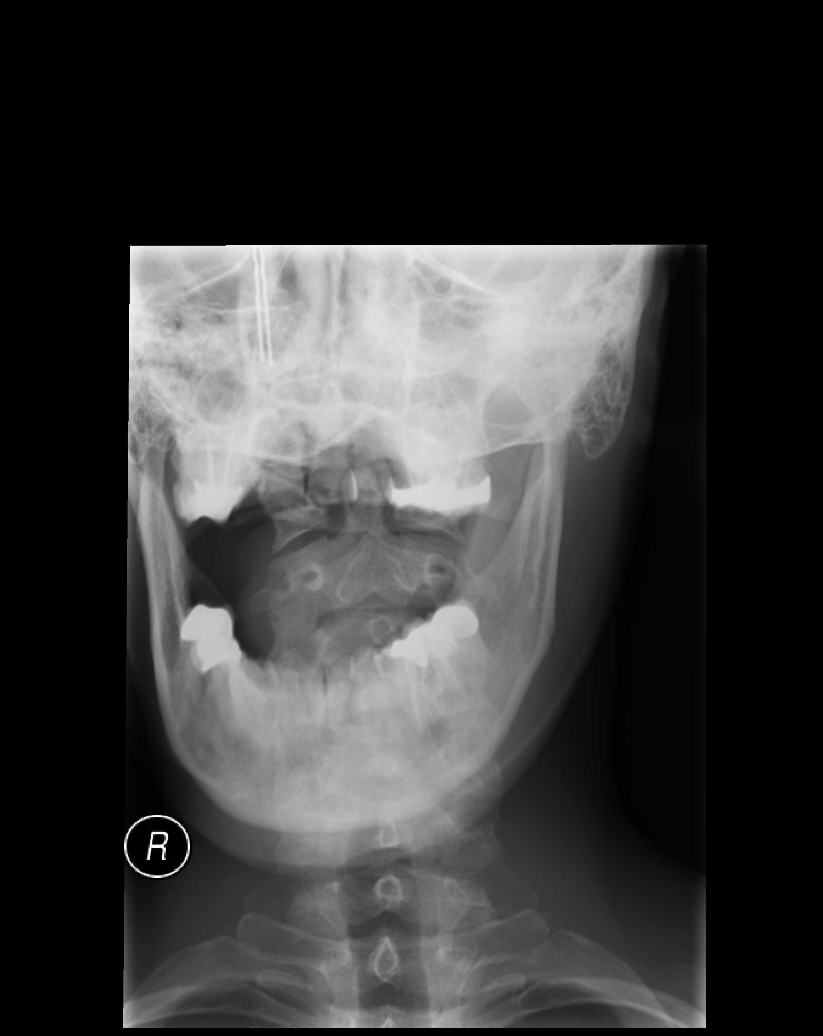

[5 of 5 positions shown; findings below may reference images not displayed]

FINDINGS: Seven cervical segments are well visualized.  Mild disc
space narrowing is noted at C4-5.  No significant neural foraminal
narrowing is noted.  No prevertebral soft tissue changes are seen.
The odontoid is within normal limits.
IMPRESSION: Mild degenerative changes C4-5.

Clinically significant discrepancy from primary report, if
provided: None

## 2015-03-23 ENCOUNTER — Ambulatory Visit (INDEPENDENT_AMBULATORY_CARE_PROVIDER_SITE_OTHER): Payer: BLUE CROSS/BLUE SHIELD | Admitting: Family Medicine

## 2015-03-23 VITALS — BP 122/80 | HR 84 | Temp 98.7°F | Resp 16 | Ht 65.5 in | Wt 200.1 lb

## 2015-03-23 DIAGNOSIS — Z0000 Encounter for general adult medical examination without abnormal findings: Secondary | ICD-10-CM | POA: Diagnosis not present

## 2015-03-23 DIAGNOSIS — R5381 Other malaise: Secondary | ICD-10-CM | POA: Diagnosis not present

## 2015-03-23 DIAGNOSIS — M791 Myalgia: Secondary | ICD-10-CM

## 2015-03-23 DIAGNOSIS — M609 Myositis, unspecified: Secondary | ICD-10-CM

## 2015-03-23 DIAGNOSIS — J301 Allergic rhinitis due to pollen: Secondary | ICD-10-CM

## 2015-03-23 DIAGNOSIS — Z01419 Encounter for gynecological examination (general) (routine) without abnormal findings: Secondary | ICD-10-CM

## 2015-03-23 DIAGNOSIS — Z131 Encounter for screening for diabetes mellitus: Secondary | ICD-10-CM

## 2015-03-23 DIAGNOSIS — R5383 Other fatigue: Secondary | ICD-10-CM | POA: Diagnosis not present

## 2015-03-23 DIAGNOSIS — K089 Disorder of teeth and supporting structures, unspecified: Secondary | ICD-10-CM

## 2015-03-23 DIAGNOSIS — M75101 Unspecified rotator cuff tear or rupture of right shoulder, not specified as traumatic: Secondary | ICD-10-CM

## 2015-03-23 DIAGNOSIS — IMO0001 Reserved for inherently not codable concepts without codable children: Secondary | ICD-10-CM

## 2015-03-23 DIAGNOSIS — Z136 Encounter for screening for cardiovascular disorders: Secondary | ICD-10-CM

## 2015-03-23 DIAGNOSIS — J029 Acute pharyngitis, unspecified: Secondary | ICD-10-CM | POA: Diagnosis not present

## 2015-03-23 LAB — POCT UA - MICROSCOPIC ONLY
CRYSTALS, UR, HPF, POC: NEGATIVE
Casts, Ur, LPF, POC: NEGATIVE
Mucus, UA: NEGATIVE
YEAST UA: NEGATIVE

## 2015-03-23 LAB — POCT RAPID STREP A (OFFICE): RAPID STREP A SCREEN: NEGATIVE

## 2015-03-23 LAB — POCT URINALYSIS DIPSTICK
Bilirubin, UA: NEGATIVE
GLUCOSE UA: NEGATIVE
Ketones, UA: NEGATIVE
LEUKOCYTES UA: NEGATIVE
NITRITE UA: NEGATIVE
Protein, UA: NEGATIVE
RBC UA: NEGATIVE
SPEC GRAV UA: 1.015
Urobilinogen, UA: 0.2
pH, UA: 6

## 2015-03-23 NOTE — Progress Notes (Signed)
Subjective:    Patient ID: Tara Beard, female    DOB: 1971/03/06, 44 y.o.   MRN: [id number]  03/23/2015  Annual Exam   HPI This 44 y.o. female presents for Complete Physical Examination.  Last physical:  01/2014 Pap smear:  menses are very heavy. 2015 WNL. Mammogram:  11-2014 3D mammography; PCP has sent for second opinion.   Colonoscopy:  Never  TDAP:  UTD Influenza:  refuses Eye exam:  UTD; 2016 Dental exam:  2016  Flu like symptoms: muscle aches, pains since mid April. There has been tremendous physical things occurring all at once.  Malaise and fatigue.  Worried about AMI or CVA or fibromyalgia.  Trying to get appointment but so booked out.  Also needs a CPE as well. Last night did not feel well.  Having teeth issues; needs 3 root canals.  Having severe R shoulder pain s/p MRI with result.  Not sleeping well due to R shoulder pain.  Coupled with allergy flare up started in February; now on immunotherapy shots.  Early March, playing tennis outside; at start of season, taking daily allergy medication for three years; followed by allergist for several years; no previous shots due to frequent traveling. Four years ago, suffered with sneezing, rhinorrhea; no response to Benadryl, Allegra, Zyrtrec. Suffered with horrible nasal congestion.  Received Prednisone shot; improved a week later.  Now this year, recurrent severe allergy flare; nothing specific place or home or outside.  Rhinorrhea; took everything possible; has lots of nasal sprays.  When this occurred again, started to play tennis; joined a tennis team.  Made appointment to start process; allergies worsened with headaches; three weeks more, still suffering with allergies. Then developed tooth ache; went to dentist.  Had one root canal completed; then needed other root canals.  Deep filling was actually placed first; then suffering with a lot of pain; taking a lot of Advil scheduled to help sleep.  Was then arranged to undergo root  canal.  Works out with Psychologist, educationaltrainer; still playing tennis; around mid April, developed muscle spasms and aches in B legs.  At this time,not havingmuch shoulder pain R.  Has been dealing with rotator cuff issues for the past year in R shoulder.  Started as leg spasms.  Trainer and pt would work out; sporadic firing in legs.  Then, symptoms not severe enough; not sure if work out related or diet. Worked through it.  Then, muscle aches progressed in legs; some muscle aches in arms; sporadic and intermittent.  Did tests on own.  No improvement with pain medications or muscle relaxers.  Still struggling with mouth pain.  Was some infection causing symptoms. Suffered with night sweats and really flushed during the night; as if acutely ill.  Did not respond to cold medication.  New allergy testing was coming up which was a priority; did not want to get prednisone shot to calm inflammation. Also with teeth issues on old dental work; orthodontists also discussed; suffered with faitgue and malaise.  Needed three root canals and those were completed on 03/01/2015.  Then muslce aches, headaches, eye pain.  S/p  WNL eye exam.  Then R shoulder started hurting as well.  Infraspinatus started spasming; locked up horribly.  Still taking Advil on consistent basis.  Went to chiropractor; ordered MRI that confirmed nasty things going on in R shoulder.  L infraspinatus spasmed.  No trigger. Locked up; trying to massage area; cold packs.  Two days later, pain starting to improve significantly; 35%.  Started allergy shots one week ago.  Lots of layers of complexity.  Not able to umbundle the events.  Orthodontist placed on abx low dose 500mg  tid Amoxicillin and felt better.  Then, the day after did not feel as well.  Does admit that shoulder pain was flaring up.  Concern for referred pain from R shoulder to L shoulder.  Last night the pain is so severe; shoulder pain has greatly worsened at night; yesterday, did not feel well.  Feels like  coming down with a cold. Aches and spasms have resolved right before root canal in early May.  Sweats resolved.  After last night, had headache and L shoulder pain.  Dull ache in L side of face; L neck pain with possible swelling.  Has been living in current home for three years; no new pets.  Has suffered with frequent mosquito bites; no travel out of country.  Usually can piece together things but unable to differentiate.  Has checked temperature; has never had a fever. Worried about AMI; mother explained that grandfather had AMI but did not know it.  Has a collegue that had a TIA at work.  Wants to confirm to rule out these things.  Not fasthing.  I would rebound; would feel better; will have good and bad days.      Review of Systems  Constitutional: Positive for diaphoresis and fatigue. Negative for fever, chills, activity change, appetite change and unexpected weight change.  HENT: Positive for congestion and dental problem. Negative for drooling, ear discharge, ear pain, facial swelling, hearing loss, mouth sores, nosebleeds, postnasal drip, rhinorrhea, sinus pressure, sneezing, sore throat, tinnitus, trouble swallowing and voice change.   Eyes: Negative for photophobia, pain, discharge, redness, itching and visual disturbance.  Respiratory: Negative for apnea, cough, choking, chest tightness, shortness of breath, wheezing and stridor.   Cardiovascular: Negative for chest pain, palpitations and leg swelling.  Gastrointestinal: Negative for nausea, vomiting, abdominal pain, diarrhea, constipation, blood in stool, abdominal distention, anal bleeding and rectal pain.  Endocrine: Negative for cold intolerance, heat intolerance, polydipsia, polyphagia and polyuria.  Genitourinary: Negative for dysuria, urgency, frequency, hematuria, flank pain, decreased urine volume, vaginal bleeding, vaginal discharge, enuresis, difficulty urinating, genital sores, vaginal pain, menstrual problem, pelvic pain and  dyspareunia.  Musculoskeletal: Positive for myalgias and arthralgias. Negative for back pain, joint swelling, gait problem, neck pain and neck stiffness.  Skin: Negative for color change, pallor, rash and wound.  Allergic/Immunologic: Negative for environmental allergies, food allergies and immunocompromised state.  Neurological: Negative for dizziness, tremors, seizures, syncope, facial asymmetry, speech difficulty, weakness, light-headedness, numbness and headaches.  Hematological: Negative for adenopathy. Does not bruise/bleed easily.  Psychiatric/Behavioral: Negative for suicidal ideas, hallucinations, behavioral problems, confusion, sleep disturbance, self-injury, dysphoric mood, decreased concentration and agitation. The patient is not nervous/anxious and is not hyperactive.     Past Medical History  Diagnosis Date  . Anemia   . Anxiety   . Allergy    Past Surgical History  Procedure Laterality Date  . Cosmetic surgery    . Foot surgery    . Nasal septum surgery     Allergies  Allergen Reactions  . Adhesive [Tape] Rash   Current Outpatient Prescriptions  Medication Sig Dispense Refill  . Ascorbic Acid (VITAMIN C) 1000 MG tablet Take 1,000 mg by mouth daily.    . Beclomethasone Diprop, Nasal, (QNASL NA) Place 1-2 sprays into the nose daily.    . fexofenadine (ALLEGRA) 180 MG tablet Take 180 mg by mouth daily.    Marland Kitchen  fish oil-omega-3 fatty acids 1000 MG capsule Take 2 g by mouth daily.    Marland Kitchen lactobacillus acidophilus (BACID) TABS Take 2 tablets by mouth 3 (three) times daily.    . mometasone (NASONEX) 50 MCG/ACT nasal spray Place 2 sprays into the nose daily.    . Multiple Vitamins-Minerals (MULTIVITAMIN WITH MINERALS) tablet Take 1 tablet by mouth daily.    . Vitamin D, Ergocalciferol, (DRISDOL) 50000 UNITS CAPS capsule Take 50,000 Units by mouth every 7 (seven) days.     No current facility-administered medications for this visit.   History   Social History  . Marital  Status: Single    Spouse Name: N/A  . Number of Children: N/A  . Years of Education: N/A   Occupational History  . Not on file.   Social History Main Topics  . Smoking status: Never Smoker   . Smokeless tobacco: Never Used  . Alcohol Use: No  . Drug Use: No  . Sexual Activity: Yes    Birth Control/ Protection: Condom   Other Topics Concern  . Not on file   Social History Narrative   Marital status: single; dating casually      Children: none      Employment: works from home [employer] x 3 years.  Travels a lot.      Lives: alone in [place].      Tobacco: none       Alcohol:  Four times per year.           Drugs: none      Exercise: plays tennis; has personal trainer      Seatbelt: 100% of time.      Sexual partners:  No STDs; last STD screening in 01/2015.  Total partners = 15.    Males only.           Family History  Problem Relation Age of Onset  . Diabetes Mother   . Hypertension Mother   . Diabetes Maternal Grandmother   . Cancer Maternal Grandmother   . Heart disease Maternal Grandfather        Objective:    BP 122/80 mmHg  Pulse 84  Temp(Src) 98.7 F (37.1 C) (Oral)  Resp 16  Ht 5' 5.5" (1.664 m)  Wt 200 lb 2 oz (90.776 kg)  BMI 32.78 kg/m2  SpO2 99%  LMP 02/27/2015 Physical Exam  Constitutional: She is oriented to person, place, and time. She appears well-developed and well-nourished. No distress.  HENT:  Head: Normocephalic and atraumatic.  Right Ear: External ear normal.  Left Ear: External ear normal.  Nose: Nose normal.  Mouth/Throat: Oropharynx is clear and moist.  Eyes: Conjunctivae and EOM are normal. Pupils are equal, round, and reactive to light.  Neck: Normal range of motion and full passive range of motion without pain. Neck supple. No JVD present. Carotid bruit is not present. No thyromegaly present.  Cardiovascular: Normal rate, regular rhythm and normal heart sounds.  Exam reveals no gallop and no friction rub.   No murmur  heard. Pulmonary/Chest: Effort normal and breath sounds normal. She has no wheezes. She has no rales. Right breast exhibits no inverted nipple, no mass, no nipple discharge, no skin change and no tenderness. Left breast exhibits no inverted nipple, no mass, no nipple discharge, no skin change and no tenderness. Breasts are symmetrical.  Abdominal: Soft. Bowel sounds are normal. She exhibits no distension and no mass. There is no tenderness. There is no rebound and no guarding.  Genitourinary:  Vagina normal and uterus normal. There is no rash, tenderness or lesion on the right labia. There is no rash, tenderness or lesion on the left labia. Cervix exhibits no motion tenderness, no discharge and no friability. Right adnexum displays no mass, no tenderness and no fullness. Left adnexum displays no mass, no tenderness and no fullness.  Musculoskeletal:       Right shoulder: She exhibits decreased range of motion, tenderness and pain.       Left shoulder: She exhibits decreased range of motion and pain. She exhibits no tenderness.       Right knee: Normal.       Left knee: Normal.       Right ankle: Normal.       Left ankle: Normal.       Cervical back: She exhibits decreased range of motion, tenderness and pain. She exhibits no bony tenderness.       Right hand: Normal.       Left hand: Normal.  Lymphadenopathy:    She has no cervical adenopathy.  Neurological: She is alert and oriented to person, place, and time. She has normal reflexes. No cranial nerve deficit. She exhibits normal muscle tone. Coordination normal.  Skin: Skin is warm and dry. No rash noted. She is not diaphoretic. No erythema. No pallor.  Psychiatric: She has a normal mood and affect. Her behavior is normal. Judgment and thought content normal.  Nursing note and vitals reviewed.       Assessment & Plan:   1. Routine physical examination   2. Encounter for routine gynecological examination   3. Malaise and fatigue   4.  Myalgia and myositis   5. Screening for diabetes mellitus   6. Allergic rhinitis due to pollen   7. Dental disease   8. RCT (rotator cuff tear), right     1. Complete Physical Examination: anticipatory guidance --- exercise, weight loss.  Pap smear obtained. Mammogram UTD. Immunizations UTD; pt refuses flu vaccines.  Recommend safe sexual practices and safe driving; pt declines contraception at this time. 2. Gynecological exam: pap smear obtained; mammogram UTD.  Heavy yet regular menses; pt declines contraception at this time. 3.  Malaise and fatigue: New. Multiple issues occurring in past two months; obtain labs.   4.  Myalgias: New.  Associated with many stressors; obtain autoimmune labs. If symptoms persists for next 1-2 months, refer to rheumatology.   5.  R rotator cuff tear: New.  S/p MRI; referred to ortho. 6.  Allergic Rhinitis: uncontrolled; recently started immunotherapy with injections per allergy. 7.  Dental issues: recent dental issues with three different procedures. 8.  Screening diabetes: obtain glucose, HgbA1c.   Meds ordered this encounter  Medications  . fexofenadine (ALLEGRA) 180 MG tablet    Sig: Take 180 mg by mouth daily.  . Beclomethasone Diprop, Nasal, (QNASL NA)    Sig: Place 1-2 sprays into the nose daily.    No Follow-up on file.   Brennon Otterness Paulita Fujita, M.D. [facility] [address] [place], [place]  [address] [phone] phone [phone] fax

## 2015-03-23 NOTE — Patient Instructions (Signed)

## 2015-03-24 LAB — HEMOGLOBIN A1C
HEMOGLOBIN A1C: 5.3 % (ref <5.7)
MEAN PLASMA GLUCOSE: 105 mg/dL (ref <117)

## 2015-03-24 LAB — CBC WITH DIFFERENTIAL/PLATELET
Basophils Absolute: 0 10*3/uL (ref 0.0–0.1)
Basophils Relative: 0 % (ref 0–1)
EOS PCT: 3 % (ref 0–5)
Eosinophils Absolute: 0.1 10*3/uL (ref 0.0–0.7)
HCT: 37.5 % (ref 36.0–46.0)
HEMOGLOBIN: 12.7 g/dL (ref 12.0–15.0)
LYMPHS ABS: 2.3 10*3/uL (ref 0.7–4.0)
LYMPHS PCT: 46 % (ref 12–46)
MCH: 29.4 pg (ref 26.0–34.0)
MCHC: 33.9 g/dL (ref 30.0–36.0)
MCV: 86.8 fL (ref 78.0–100.0)
MPV: 10.4 fL (ref 8.6–12.4)
Monocytes Absolute: 0.3 10*3/uL (ref 0.1–1.0)
Monocytes Relative: 6 % (ref 3–12)
Neutro Abs: 2.2 10*3/uL (ref 1.7–7.7)
Neutrophils Relative %: 45 % (ref 43–77)
Platelets: 258 10*3/uL (ref 150–400)
RBC: 4.32 MIL/uL (ref 3.87–5.11)
RDW: 12.4 % (ref 11.5–15.5)
WBC: 4.9 10*3/uL (ref 4.0–10.5)

## 2015-03-24 LAB — COMPREHENSIVE METABOLIC PANEL
ALBUMIN: 4.3 g/dL (ref 3.5–5.2)
ALT: 14 U/L (ref 0–35)
AST: 22 U/L (ref 0–37)
Alkaline Phosphatase: 69 U/L (ref 39–117)
BILIRUBIN TOTAL: 1 mg/dL (ref 0.2–1.2)
BUN: 11 mg/dL (ref 6–23)
CHLORIDE: 100 meq/L (ref 96–112)
CO2: 27 mEq/L (ref 19–32)
CREATININE: 0.88 mg/dL (ref 0.50–1.10)
Calcium: 9.7 mg/dL (ref 8.4–10.5)
GLUCOSE: 92 mg/dL (ref 70–99)
POTASSIUM: 4.3 meq/L (ref 3.5–5.3)
SODIUM: 135 meq/L (ref 135–145)
Total Protein: 7.7 g/dL (ref 6.0–8.3)

## 2015-03-24 LAB — RHEUMATOID FACTOR: Rhuematoid fact SerPl-aCnc: <10 IU/mL (ref <=14)

## 2015-03-24 LAB — VITAMIN B12: VITAMIN B 12: 980 pg/mL — AB (ref 211–911)

## 2015-03-24 LAB — C-REACTIVE PROTEIN

## 2015-03-24 LAB — TSH: TSH: 2.741 u[IU]/mL (ref 0.350–4.500)

## 2015-03-24 LAB — CK: Total CK: 105 U/L (ref 7–177)

## 2015-03-25 LAB — CULTURE, GROUP A STREP: Organism ID, Bacteria: NORMAL

## 2015-03-25 LAB — VITAMIN D 25 HYDROXY (VIT D DEFICIENCY, FRACTURES): VIT D 25 HYDROXY: 36 ng/mL (ref 30–100)

## 2015-03-27 LAB — ANA: ANA: NEGATIVE

## 2015-03-28 ENCOUNTER — Telehealth: Payer: Self-pay

## 2015-03-28 NOTE — Telephone Encounter (Signed)
Patient is calling because she would like for Dr. Cleta Albertsaub to go over her lab results with her since Dr. Katrinka BlazingSmith it out for the next two weeks. She would also like for her labs printed so that she can pick them up. Please call! [phone]

## 2015-03-29 LAB — PAP IG AND HPV HIGH-RISK: HPV DNA High Risk: NOT DETECTED

## 2015-03-30 NOTE — Telephone Encounter (Signed)
I will call 

## 2015-03-30 NOTE — Telephone Encounter (Signed)
Called pt to go over lab results. Pt wants to go over them with Dr. Cleta Alberts.

## 2015-11-06 ENCOUNTER — Telehealth (HOSPITAL_BASED_OUTPATIENT_CLINIC_OR_DEPARTMENT_OTHER): Payer: Self-pay | Admitting: Orthopaedic Surgery

## 2015-11-06 NOTE — Telephone Encounter (Signed)
(  TEXTING IS AN OPTION FOR [facility] CLINICS ONLY)  Is this a [facility] clinic? No      RETURN CALL: Detailed message on voicemail only      SUBJECT:  General Message     REASON FOR REQUEST: Patient is checking in on recently sent urgent referral to Dr. Raynald KempHsu, symptom not listed in PST    MESSAGE: Please return patient call and discuss referral and scheduling process ASAP.

## 2015-11-07 ENCOUNTER — Ambulatory Visit: Payer: No Typology Code available for payment source | Attending: Orthopaedic Surgery | Admitting: Orthopaedic Surgery

## 2015-11-07 ENCOUNTER — Telehealth (HOSPITAL_BASED_OUTPATIENT_CLINIC_OR_DEPARTMENT_OTHER): Payer: Self-pay | Admitting: Rehabilitative and Restorative Service Providers"

## 2015-11-07 ENCOUNTER — Encounter (HOSPITAL_BASED_OUTPATIENT_CLINIC_OR_DEPARTMENT_OTHER): Payer: Self-pay | Admitting: Orthopaedic Surgery

## 2015-11-07 VITALS — BP 128/83 | HR 93 | Ht 65.5 in | Wt 195.0 lb

## 2015-11-07 DIAGNOSIS — M67912 Unspecified disorder of synovium and tendon, left shoulder: Secondary | ICD-10-CM | POA: Insufficient documentation

## 2015-11-07 DIAGNOSIS — Z9889 Other specified postprocedural states: Secondary | ICD-10-CM | POA: Insufficient documentation

## 2015-11-07 NOTE — Telephone Encounter (Signed)
(  TEXTING IS AN OPTION FOR [facility] CLINICS ONLY)  Is this a [facility] clinic? No      RETURN CALL: Not OK to leave any message      SUBJECT:  Appointment Request     REASON FOR REQUEST/SYMPTOMS: physical therapy  REFERRING PROVIDER: Judd Gaudier  REQUEST APPOINTMENT WITH: any appropriate  REQUESTED DATE: please coordinate with patient , TIME: please coordinate with patient  UNABLE TO APPOINTMENT BECAUSE: no scheduling instruction s    Please call patient back to advise.    Thank you!

## 2015-11-07 NOTE — Patient Instructions (Signed)
It was a pleasure seeing you in clinic today.  Please let us know if you have any questions for us and we look forward to seeing at your next appointment.    If you have any questions, concerns, or would like to contact Dr. Nini Cavan directly, please feel free to e-mail me directly at [email or web address].    You can schedule an appointment to see us by calling [phone].    You may also find useful information about various orthopaedic conditions and what we do at [email or web address] .

## 2015-11-07 NOTE — Telephone Encounter (Signed)
Spoke with pt, created referral, put into review with Dr. Raynald Kemp

## 2015-11-07 NOTE — Progress Notes (Signed)
[facility] Department of Orthopaedics & Sports Medicine  Shoulder And Elbow Service       [facility]; [address] [place] ; [place], [place]  [address]  [phone]; [phone]    [email or web address]    Patient Name: Jenny Watson  Date of Birth: [birth year]  Medical Record #: [id number]    Primary Care Provider:  PCP NO, MD  Used For Watson Upwn Jenny      Assessment  and Plan  Jenny Watson is a 45 year old female with bilateral shoulder issues.    Left shoulder: possibly rotator cuff tendinopathy versus biceps pathology. Has not had any work-up or therapy so far. Suggest PT for cuff/deltoid strengthening and scapular stabilizer strengthening.    Right shoulder: recently status post rotator cuff repair done at [facility]. Suggest the following therapy regimen:     PHASE I: Protected ROM (6 weeks)   Sling should be in place when not performing exercises.   May start active scapular mobility exercises at 3 to 4 weeks - Must keep the shoulder musculature relaxed.   Avoid all active and active assistive exercises until cleared by the surgeon. This includes pulley exercises, wand and supine assisted exercises.   Initiate exercise program 3 times per day:  Immediate elbow, forearm and hand range of motion out of sling  Pendulum exercises  Supine active assist forward elevation to 90 degrees  PHASE II: Progressive ROM (6 to 12 weeks)   May discontinue sling.   Lifting restriction of 5 pounds should be reinforced with patient.   Start AAROM and AROM - includes pulleys, wand and supine gravity assisted exercises. Emphasize all motions including IR behind the back at 10-12 weeks.   Isolate and strengthen scapular stabilizers.   Progress PROM and terminal capsular stretching of the shoulder as needed.   Avoid AROM in positions of subacromial impingement.   May start gentle rotator cuff strengthening at 8 weeks  PHASE III: (> 12 weeks)   Discontinue formal lifting  restrictions.   Advance rotator cuff and shoulder strengthening (Theraband, dumbbells, Hughston's exercises, etc). Include home cuff strengthening program. Continue to emphasize scapular stabilizers.   Equate active and passive range of motion. Encourage scapulohumeral mechanics during active shoulder motion.   Simulate work/recreational activities as rotator cuff strength and endurance improve.     Procedures scheduled/performed: none  Pain management: tylenol, NSAIDs  Therapy/motion: as above  Follow-up: I will plan to see Jenny Watson back in 4 weeks.  Imaging at next appointment: 3v of the left shoulder         Chief Complaint   Patient presents with   . Musculoskeletal Problem     post right shoulder impingement sndrome and rotator cuff tear Dos: 10/12/15       SUBJECTIVE HISTORY  She is a 45 year old female with bilateral shoulder pain for the past couple years. Right side became symptomatic a couple years ago. Started playing more tennis around that time and had increasing tennis elbow problems. Eventually changed her racquet and strings, but then started having shoulder issues. Had PT and some PRP and other types of injections but ended up having surgery at [facility] with Dr. Sherlyn Lick on 10/12/15: right arthroscopic subacromial decompression and rotator cuff repair for a 1.5cm full thickness supraspinatus tear. She has been in a sling since the time of surgery but has not started any PT.    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 Location: Right Shoulder  2. Severity (1 = Watson Pain, 10 = Severe Pain): 3-4  3. Context - How did this problem begin? Refer to subjective note above.  4. Modifying Factors:  What makes symptom(s) worse? Using affected side  Work  What improves your symptom(s)? Rest  Ice  NSAIDs    Special Questionnaires   Right Left    SANE (Single Assessment Numeric  Evaluation)  25% 100%        REVIEW OF SYSTEMS   Constitutional: negative for weight gain, weight loss, fatigue, insomnia, fever and night-sweats/chills   Eyes: glasses/contacts    Ear/Nose/Throat: sinus trouble   Cardiovascular: negative for irregular heartbeat, high blood pressure, chest pain, fluttering in chest and coronary disease   Respiratory: negative for shortness of breath, difficulty breathing, lung disease and persistent cough   Gastrointestinal: negative for decreased appetite, constipation, heartburn, nausea, diarrhea and hepatitis   Musculoskeletal:  arthritis   Genitourinary: negative for kidney stone, bladder/kidney infections, prostate problems and painful urinating   Skin/Integument:  dermatitis   Neurological:  negative for seizures, tingling, numbness and severe headaches   Psychiatric:  negative for anxiety, depression or other mental health issues   Endocrine:  negative for increased thirst, diabetes and thyroid disorders   Blood/Lymphatic: negative for bleeding or clotting problems, anemia and swollen or enlarged lymph nodes   Immunological: hay fever   Cancer: negative for cancer     Other History    Social History     Occupational History   . Not on file.     Social History Main Topics   . Smoking status: Not on file   . Smokeless tobacco: Not on file   . Alcohol Use: Not on file   . Drug Use: Not on file   . Sexual Activity: Not on file     Marital Status: Single   Number of adults living with Jenny Watson: 0        Watson past medical history on file.  Past Surgical History   Procedure Laterality Date   . Unlisted procedure shoulder Right Dos: 10/12/15     right shoulder impingement sndrome and rotator cuff tear      Review of patient's allergies indicates:  Allergies   Allergen Reactions   . Adhesives      Adhesive tape/bandage    . Other (See Comments)      Seasonal allergies      Watson current outpatient prescriptions on file.     Watson current facility-administered medications for this visit.      Watson family history on file.    PHYSICAL EXAMINATION    BP 128/83 mmHg  Pulse 93  Ht 5' 5.5" (1.664 m)  Wt 195 lb (88.451 kg)  BMI 31.94 kg/m2  SpO2 99%    General appearance:  Ms. Esteve is a well developed, well nourished female in Watson apparent distress.     Psychological:  Her judgment, insight, memory, mood and affect appear to be within normal limits    Neurological:  She is alert and oriented x 3     Respiratory:  She is without any obvious respiratory distress    ENT:  She is able to hear and understand verbal questions and commands    SHOULDER SPECIFIC EXAM  Right shoulder:  Well healed arthroscopic incision over right shoulder  Supine aaFE  90  ER 30    Left shoulder:  FE 170  ER 70  Good cuff strength   Mild pain with stomach compression and thumbs down abduction  Tenderness over proximal biceps/greater tuberosity    Neurovascular exam:  Motor: 5/5 axillary, musculocutaneous, posterior interosseous, anterior interosseous, and ulnar   Sensory: sensation intact to light touch in the axillary, lateral antebrachial cutaneous, median, ulnar, and radial distributions  Vascular: hand is warm and well-perfused with a 2+ radial pulse.     IMAGING STUDIES  Watson imaging done today.    Please note that today's visit lasted more than 45 minutes, of which more than half was spent in face-to-face counseling regarding surgical and conservative management options.      Ladona Ridgel, MD  Orthopaedics and Sports Medicine  [facility]  Shoulder and Elbow Team

## 2015-11-08 ENCOUNTER — Encounter (HOSPITAL_BASED_OUTPATIENT_CLINIC_OR_DEPARTMENT_OTHER): Payer: No Typology Code available for payment source | Admitting: Rehabilitative and Restorative Service Providers"

## 2015-11-08 NOTE — Telephone Encounter (Signed)
Routing to Mindy to process referral call patient with wait time information    Administrator, sports, PSS/PCC Supervisor  [facility]

## 2015-11-08 NOTE — Telephone Encounter (Signed)
Speaking with patient regarding our wait list.    Mindy Pagac  Patient Services Specialist 2  [facility]  [facility]

## 2015-11-09 ENCOUNTER — Encounter (HOSPITAL_BASED_OUTPATIENT_CLINIC_OR_DEPARTMENT_OTHER): Payer: Self-pay | Admitting: Family Medicine

## 2015-11-09 ENCOUNTER — Ambulatory Visit: Payer: No Typology Code available for payment source | Attending: Family Medicine | Admitting: Family Medicine

## 2015-11-09 VITALS — BP 125/84 | HR 77 | Ht 66.14 in | Wt 215.0 lb

## 2015-11-09 DIAGNOSIS — M7580 Other shoulder lesions, unspecified shoulder: Secondary | ICD-10-CM | POA: Insufficient documentation

## 2015-11-09 NOTE — Progress Notes (Signed)
S: Jenny Watson comes in today for her right and left shoulder.  She had shoulder pain for year mostly on the right and eventually had her shoulder operated on with a slap repair.  She moved to the [place] area 3 months ago and followed up after the surgery with Dr. Raynald Kemp who recommended PT.  She is also concerned about her left shoulder.  She has some pinching pain on the top and anterior portion of her shoulder and also some episodes where she will feel like her shoulder is a bit unstable.  It does not sound like she has true subluxation.  She wants to be proactive and treat her left shoulder before it needs surgery.  She had initially considered PRP or stem cells prior to surgery but eventually elected to go ahead with surgery.   She wonders if something like that might be appropriate for her left shoulder.      No past medical history on file.    Review of patient's allergies indicates:  Allergies   Allergen Reactions   . Adhesives      Adhesive tape/bandage    . Other (See Comments)      Seasonal allergies      No current outpatient prescriptions on file.     No current facility-administered medications for this visit.     Soc: non-smoker, works at [employer], moved form [employer]    REVIEW OF SYSTEMS: Complete ROS is negative, except for those mentioned mentioned here and in HPI.  Positive for sinus issues.     O:  Well appearing female with pleasant affect.    Exam Left Shoulder:  ROM:  180 abduction, 180 forward flexion, internal rotation to T8 on left.  Moderate scapular dysfunction .    ROM when supine with arm abducted to 90 degrees:  Left:  External rotation: 90 degrees,  Internal rotation: 90 degrees  Palpation: tender over biceps tendon  Strength:5/5 deltoid,  5/5 supraspinatous, 5/5 infraspinatous, 5/5 subscapularous, 5/5 with Speed' test.  Has some pain with resisted use of biceps tendon  Special tests: negative Yergasons,  negative O'brien's, negative Crank, negative Apprehesion, negative  Relocation, negative Surprise, negative sulcus sign, negative load and shift.  Neuro: Sensation normal  Vascular: pulses normal    Left shoulder in sling    A: Biceps tendinosis     P:  - She has not really tried anything for the left shoulder yet.   - She has biceps tendinitis, some impingement and scapular dysfunction.  - She would benefit from PT on her left shoulder  - She can get PT for both shoulders at the same time.   - Also referred for acupuncture  - She should return in 6 - 8 weeks to see how she is doing.

## 2015-11-09 NOTE — Patient Instructions (Signed)
Let's start PT.        Acupuncture:      Griselda Miner at [facility], [address], [address], [phone]      [facility]  [facility]  [address]. [place], [place]  [place]  [address] [place]  Tel: [phone]

## 2015-11-15 ENCOUNTER — Ambulatory Visit
Payer: No Typology Code available for payment source | Attending: Family Medicine | Admitting: Rehabilitative and Restorative Service Providers"

## 2015-11-15 DIAGNOSIS — Z9889 Other specified postprocedural states: Secondary | ICD-10-CM | POA: Insufficient documentation
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
## 2015-11-18 ENCOUNTER — Encounter (HOSPITAL_BASED_OUTPATIENT_CLINIC_OR_DEPARTMENT_OTHER): Payer: No Typology Code available for payment source | Admitting: Rehabilitative and Restorative Service Providers"

## 2015-11-24 ENCOUNTER — Encounter (HOSPITAL_BASED_OUTPATIENT_CLINIC_OR_DEPARTMENT_OTHER): Payer: No Typology Code available for payment source | Admitting: Rehabilitative and Restorative Service Providers"

## 2015-11-27 ENCOUNTER — Encounter (HOSPITAL_BASED_OUTPATIENT_CLINIC_OR_DEPARTMENT_OTHER): Payer: Self-pay | Admitting: Physician Assistant

## 2015-11-29 ENCOUNTER — Encounter (HOSPITAL_BASED_OUTPATIENT_CLINIC_OR_DEPARTMENT_OTHER): Payer: No Typology Code available for payment source | Admitting: Rehabilitative and Restorative Service Providers"

## 2015-12-02 ENCOUNTER — Encounter (HOSPITAL_BASED_OUTPATIENT_CLINIC_OR_DEPARTMENT_OTHER): Payer: No Typology Code available for payment source | Admitting: Rehabilitative and Restorative Service Providers"

## 2015-12-05 ENCOUNTER — Ambulatory Visit
Payer: No Typology Code available for payment source | Attending: Family Medicine | Admitting: Rehabilitative and Restorative Service Providers"

## 2015-12-05 DIAGNOSIS — Z9889 Other specified postprocedural states: Secondary | ICD-10-CM | POA: Insufficient documentation
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
## 2015-12-12 ENCOUNTER — Encounter (HOSPITAL_BASED_OUTPATIENT_CLINIC_OR_DEPARTMENT_OTHER): Payer: No Typology Code available for payment source | Admitting: Orthopaedic Surgery

## 2015-12-12 NOTE — Progress Notes (Deleted)
[facility] Department of Orthopaedics & Sports Medicine  Shoulder And Elbow Service       [facility]; [address] [place] ; [place], [place]  [address]  [phone]; [phone]    [email or web address]    Patient Name: Jenny Watson  Date of Birth: 02/11/1971  Medical Record #: [id number]    Primary Care Provider:  PCP NO, MD  Used For No Upwn Pcp      Assessment  and Plan  Jenny Watson is a 45 year old female following up for bilateral shoulder issues.    Left shoulder: ***possibly rotator cuff tendinopathy versus biceps pathology. Has not had any work-up or therapy so far. Suggest PT for cuff/deltoid strengthening and scapular stabilizer strengthening.    Right shoulder: ***recently status post rotator cuff repair done at [facility]. Suggest the following therapy regimen:     PHASE I: Protected ROM (6 weeks)   Sling should be in place when not performing exercises.   May start active scapular mobility exercises at 3 to 4 weeks - Must keep the shoulder musculature relaxed.   Avoid all active and active assistive exercises until cleared by the surgeon. This includes pulley exercises, wand and supine assisted exercises.   Initiate exercise program 3 times per day:  Immediate elbow, forearm and hand range of motion out of sling  Pendulum exercises  Supine active assist forward elevation to 90 degrees  PHASE II: Progressive ROM (6 to 12 weeks)   May discontinue sling.   Lifting restriction of 5 pounds should be reinforced with patient.   Start AAROM and AROM - includes pulleys, wand and supine gravity assisted exercises. Emphasize all motions including IR behind the back at 10-12 weeks.   Isolate and strengthen scapular stabilizers.   Progress PROM and terminal capsular stretching of the shoulder as needed.   Avoid AROM in positions of subacromial impingement.   May start gentle rotator cuff strengthening at 8 weeks  PHASE III: (> 12  weeks)   Discontinue formal lifting restrictions.   Advance rotator cuff and shoulder strengthening (Theraband, dumbbells, Hughston's exercises, etc). Include home cuff strengthening program. Continue to emphasize scapular stabilizers.   Equate active and passive range of motion. Encourage scapulohumeral mechanics during active shoulder motion.   Simulate work/recreational activities as rotator cuff strength and endurance improve.     Procedures scheduled/performed: none  Pain management: tylenol, NSAIDs  Therapy/motion: as above  Follow-up: I will plan to see Jenny Watson back ***  Imaging at next appointment: ***         No chief complaint on file.      SUBJECTIVE HISTORY  12/12/15: ***    11/07/15: She is a 45 year old female with bilateral shoulder pain for the past couple years. Right side became symptomatic a couple years ago. Started playing more tennis around that time and had increasing tennis elbow problems. Eventually changed her racquet and strings, but then started having shoulder issues. Had PT and some PRP and other types of injections but ended up having surgery at [facility] with Dr. Sherlyn Lick on 10/12/15: right arthroscopic subacromial decompression and rotator cuff repair for a 1.5cm full thickness supraspinatus tear. She has been in a sling since the time of surgery but has not started any PT.    Related Information:  Handed: right handed     History of Present Illness   1. Location: Right Shoulder  2. Severity (1 = No Pain, 10 = Severe  Pain): ***  Special Questionnaires   Right Left    SANE (Single Assessment Numeric Evaluation)  ***% ***%        REVIEW OF SYSTEMS   Negative for fever, chills, nausea, vomiting, cardiac symptoms, respiratory symptoms, or diabetes    Other History    Social History     Occupational History   . Not on file.     Social History Main Topics   . Smoking status: Never Smoker    . Smokeless tobacco: Not on file   . Alcohol Use: Not on file   . Drug Use: Not  on file   . Sexual Activity: Not on file     No past medical history on file.  Past Surgical History   Procedure Laterality Date   . Unlisted procedure shoulder Right Dos: 10/12/15     right shoulder impingement sndrome and rotator cuff tear      Review of patient's allergies indicates:  Allergies   Allergen Reactions   . Adhesives      Adhesive tape/bandage    . Other (See Comments)      Seasonal allergies      No current outpatient prescriptions on file.     No current facility-administered medications for this visit.     No family history on file.    PHYSICAL EXAMINATION    There were no vitals taken for this visit.    General appearance:  Jenny Watson is a well developed, well nourished female in no apparent distress.     Psychological:  Her judgment, insight, memory, mood and affect appear to be within normal limits    Neurological:  She is alert and oriented x 3     Respiratory:  She is without any obvious respiratory distress    ENT:  She is able to hear and understand verbal questions and commands    SHOULDER SPECIFIC EXAM  Right shoulder:  ***Well healed arthroscopic incision over right shoulder  ***Supine aaFE 90  ***ER 30    Left shoulder:  ***FE 170  ***ER 70  Good cuff strength   Mild pain with stomach compression and thumbs down abduction  Tenderness over proximal biceps/greater tuberosity    Neurovascular exam:  Motor: 5/5 axillary, musculocutaneous, posterior interosseous, anterior interosseous, and ulnar   Sensory: sensation intact to light touch in the axillary, lateral antebrachial cutaneous, median, ulnar, and radial distributions  Vascular: hand is warm and well-perfused with a 2+ radial pulse.     IMAGING STUDIES  Left shoulder ap, grashey and axillary lateral view demonstrate ***    De Hollingshead, PA-C  Orthopaedics and Sports Medicine  [facility]  Shoulder and Elbow Team

## 2015-12-13 NOTE — Progress Notes (Signed)
This encounter was opened in error.

## 2015-12-18 ENCOUNTER — Encounter (HOSPITAL_BASED_OUTPATIENT_CLINIC_OR_DEPARTMENT_OTHER): Payer: No Typology Code available for payment source | Admitting: Rehabilitative and Restorative Service Providers"

## 2015-12-21 ENCOUNTER — Encounter (HOSPITAL_BASED_OUTPATIENT_CLINIC_OR_DEPARTMENT_OTHER): Payer: No Typology Code available for payment source | Admitting: Rehabilitative and Restorative Service Providers"

## 2015-12-21 ENCOUNTER — Encounter (HOSPITAL_BASED_OUTPATIENT_CLINIC_OR_DEPARTMENT_OTHER): Payer: Self-pay | Admitting: Physician Assistant

## 2015-12-23 ENCOUNTER — Ambulatory Visit
Payer: No Typology Code available for payment source | Attending: Family Medicine | Admitting: Rehabilitative and Restorative Service Providers"

## 2015-12-23 DIAGNOSIS — Z9889 Other specified postprocedural states: Secondary | ICD-10-CM | POA: Insufficient documentation

## 2015-12-23 NOTE — Progress Notes (Signed)
[facility] PH[facility]ICAL THERAP[facility] DAILY NOTE - SHOULDER    Date of Onset/Surgery: Dec 22nd surgery, chronic bilateral shoulder pain  Diagnosis: (Z98.890) S/P rotator cuff repair  (primary encounter diagnosis)  Visit # 3 Insurance Authorization: [id number]  Referring Provider: Maeola Sarah, MD  Time in 9:10 am (pt arrives 10 min late) Time out 9:50 am  MD Referral/Order:    PRECAUTIONS: s/p RTC repair and slap tear repair on right shoulder see protocol below    SUBJECTIVE: Daily Comments: Patient reports she feels things are definitely flared up this week. She noticed soreness following heavier activities such as pushing/pulling heavy doors but otherwise has been good about not lifting more than 5#, and also has only been doing her AAROM HEP. She has been icing, sitting in the sauna at the gym and using topical remedies. She is having a hard time sleeping on her back or even on the unaffected side and would like to discuss sleeping positions.     Site of symptoms Bilateral shoulder pain, right slap tear and RTC repair Dec 22nd on the right   Mechanism of injury Chronic wear and tear, history of playing tennis last year that set off symptoms     Description of symptoms Left shoulder has pain anteriorly and posteriorly, right shoulder is comfortable in sling   Change in symptoms since onset Right shoulder improved, left shoulder worsening due to compensation   Aggravating activities Reaching overhead, reaching behind back and reach to opposite shoulder   Alleviating activities rest   Previous history of injury to same area none   Previous history of other related injuries See pmhx   Other current injuries Knee pain with running   Other health issues  See pmhx         PRIOR FUNCTIONAL STATUS: No pain, impairment or functional limitation     has no past medical history of NEGATIVE PAST MEDICAL HISTORY OF.   has past surgical history that includes UNLISTED PROCEDURE SHOULDER (Right, Dos: 10/12/15).  No outpatient  prescriptions have been marked as taking for the 12/23/15 encounter (Rehab Therapy) with Rocky Morel, PTA.         OBJECTIVE:    Manual Therapy Technique 12/05/2015 12/23/15      PROM Shoulder flexion, scaption, IR, ER x      GHJ mobs   Inf post for pain relief gr I II                                      Treatment Time  15' 15'          Therapeutic Exercise  12/05/2015 12/23/15          Sets/reps       Seated scap retraction x10       Supine cane flexion x10       Wall walks flexion Hands on cues for GHJ depression x5       HEP emailed + reviewed x       IR cane stretch standing  2x12 reps slow      IR/ER  Reactive Iso YTB 10x ea 5"      IR/ER  AROM YTB 2x10 ea      Sh ext  Reactive Iso 10x5"      Instructed in side sleeping position with pillow  x  Treatment Time 39' 25'        Patient Education: Discussed protocol progression and reviewed proper side sleeping positions on L with R propped.     HEP:  Created by Lolita Rieger Feb 14th, 2017   View at "[email or web address]" using code: [id number]    ASSESSMENT: Despite exacerbation, patient able to progress through next phase of protocol well when she paces herself. When she initiates movement too quickly she feels some pain in deltoid region. Her tolerance for IR stretching is good.    Clinical Diagnosis: The patient presents with signs and symptoms consistent with the diagnosis of s/p right shoulder RTC and slap tear repair, left shoulder pain.  The patients rehabilitation potential is: Excellent   Short term Goals  Number of weeks Goal Met    1  Pain free AROM bilaterally 6 no    2  Pain free isometric MMT on left 6 no    3  Pt to be independent in HEP with 100% accuracy when demonstrated  4 weeks no     Long term Goals   Goal Met    1  Pain free equal isometric MMT bilaterally 8 no   2  Pain free ADLs  8 no    3  Pt to be independent in HEP with 100% accuracy when demonstrated and able to progress as symptoms allow - 8 weeks 8 weeks no       Patient  Goals: return to PLOF These goals were discussed and the patient agrees with them.  Planned Interventions:  therapeutic exercise, manual therapy, ultrasound, electrical stimulation (Attended), electrical stimulation (Supervised), therapeutic activities and neuromuscular reeducation.  This patient will be seen by a physical therapist or a physical therapist assistant following this plan of care.    Frequency & Duration: 1-2x per week for 6-8 weeks decreasing in frequency as appropriate given functional progression    Thank you for this referral.            Rocky Morel, PTA    Protocol for right shoulder:  PHASE I: Protected ROM (6 weeks)   Sling should be in place when not performing exercises.   May start active scapular mobility exercises at 3 to 4 weeks - Must keep the shoulder musculature relaxed.   Avoid all active and active assistive exercises until cleared by the surgeon. This includes pulley exercises, wand and supine assisted exercises.   Initiate exercise program 3 times per day:  Immediate elbow, forearm and hand range of motion out of sling  Pendulum exercises  Supine active assist forward elevation to 90 degrees  PHASE II: Progressive ROM (6 to 12 weeks)   May discontinue sling.   Lifting restriction of 5 pounds should be reinforced with patient.   Start AAROM and AROM - includes pulleys, wand and supine gravity assisted exercises. Emphasize all motions including IR behind the back at 10-12 weeks.   Isolate and strengthen scapular stabilizers.   Progress PROM and terminal capsular stretching of the shoulder as needed.   Avoid AROM in positions of subacromial impingement.   May start gentle rotator cuff strengthening at 8 weeks  PHASE III: (> 12 weeks)   Discontinue formal lifting restrictions.   Advance rotator cuff and shoulder strengthening (Theraband, dumbbells, Hughston's exercises, etc). Include home cuff strengthening program. Continue to emphasize scapular  stabilizers.   Equate active and passive range of motion. Encourage scapulohumeral mechanics during active shoulder motion.   Simulate work/recreational activities as rotator cuff strength and  endurance improve.

## 2015-12-28 ENCOUNTER — Encounter (HOSPITAL_BASED_OUTPATIENT_CLINIC_OR_DEPARTMENT_OTHER): Payer: No Typology Code available for payment source | Admitting: Family Medicine

## 2016-01-04 ENCOUNTER — Encounter (HOSPITAL_BASED_OUTPATIENT_CLINIC_OR_DEPARTMENT_OTHER): Payer: No Typology Code available for payment source | Admitting: Rehabilitative and Restorative Service Providers"

## 2016-01-08 ENCOUNTER — Encounter (HOSPITAL_BASED_OUTPATIENT_CLINIC_OR_DEPARTMENT_OTHER): Payer: No Typology Code available for payment source | Admitting: Orthopaedic Surgery

## 2016-01-11 ENCOUNTER — Encounter (HOSPITAL_BASED_OUTPATIENT_CLINIC_OR_DEPARTMENT_OTHER): Payer: No Typology Code available for payment source | Admitting: Rehabilitative and Restorative Service Providers"

## 2016-01-18 ENCOUNTER — Encounter (HOSPITAL_BASED_OUTPATIENT_CLINIC_OR_DEPARTMENT_OTHER): Payer: No Typology Code available for payment source | Admitting: Rehabilitative and Restorative Service Providers"

## 2016-03-22 ENCOUNTER — Inpatient Hospital Stay: Payer: Self-pay

## 2016-11-26 ENCOUNTER — Inpatient Hospital Stay: Payer: Self-pay

## 2017-05-22 ENCOUNTER — Ambulatory Visit (HOSPITAL_BASED_OUTPATIENT_CLINIC_OR_DEPARTMENT_OTHER): Admit: 2017-05-22 | Discharge: 2017-05-22 | Disposition: A | Discharge status: Home / Self Care | Payer: Self-pay

## 2018-09-27 ENCOUNTER — Inpatient Hospital Stay: Payer: Self-pay
